# Patient Record
Sex: Female | Born: 1980 | Race: White | Hispanic: No | Marital: Married | Smoking: Former smoker
Health system: Southern US, Community
[De-identification: ages and names within clinical notes are randomized; demographics above are authoritative.]

## PROBLEM LIST (undated history)

## (undated) DIAGNOSIS — F419 Anxiety disorder, unspecified: Secondary | ICD-10-CM

## (undated) DIAGNOSIS — Z87442 Personal history of urinary calculi: Secondary | ICD-10-CM

## (undated) DIAGNOSIS — D649 Anemia, unspecified: Secondary | ICD-10-CM

## (undated) HISTORY — DX: Anemia, unspecified: D64.9

## (undated) HISTORY — PX: BREAST REDUCTION SURGERY: SHX8

## (undated) HISTORY — DX: Personal history of urinary calculi: Z87.442

## (undated) HISTORY — DX: Anxiety disorder, unspecified: F41.9

---

## 2007-01-08 ENCOUNTER — Ambulatory Visit: Payer: Self-pay | Admitting: Pediatrics

## 2008-02-24 ENCOUNTER — Ambulatory Visit: Payer: Self-pay | Admitting: Obstetrics and Gynecology

## 2008-09-26 ENCOUNTER — Inpatient Hospital Stay: Payer: Self-pay

## 2008-12-29 ENCOUNTER — Ambulatory Visit: Payer: Self-pay | Admitting: Family Medicine

## 2008-12-29 DIAGNOSIS — Z9189 Other specified personal risk factors, not elsewhere classified: Secondary | ICD-10-CM | POA: Insufficient documentation

## 2008-12-29 DIAGNOSIS — Z87442 Personal history of urinary calculi: Secondary | ICD-10-CM

## 2008-12-29 DIAGNOSIS — F411 Generalized anxiety disorder: Secondary | ICD-10-CM

## 2008-12-29 DIAGNOSIS — D649 Anemia, unspecified: Secondary | ICD-10-CM

## 2008-12-29 HISTORY — DX: Generalized anxiety disorder: F41.1

## 2008-12-30 ENCOUNTER — Encounter (INDEPENDENT_AMBULATORY_CARE_PROVIDER_SITE_OTHER): Payer: Self-pay | Admitting: *Deleted

## 2008-12-30 LAB — CONVERTED CEMR LAB
Basophils Absolute: 0 10*3/uL (ref 0.0–0.1)
CO2: 30 meq/L (ref 19–32)
Calcium: 9.6 mg/dL (ref 8.4–10.5)
Chloride: 104 meq/L (ref 96–112)
Creatinine, Ser: 0.9 mg/dL (ref 0.4–1.2)
Eosinophils Absolute: 0.1 10*3/uL (ref 0.0–0.7)
Glucose, Bld: 74 mg/dL (ref 70–99)
Hemoglobin: 14.2 g/dL (ref 12.0–15.0)
Lymphocytes Relative: 42.4 % (ref 12.0–46.0)
MCHC: 34.5 g/dL (ref 30.0–36.0)
MCV: 89.7 fL (ref 78.0–100.0)
Monocytes Absolute: 0.4 10*3/uL (ref 0.1–1.0)
Neutro Abs: 3.2 10*3/uL (ref 1.4–7.7)
Neutrophils Relative %: 50 % (ref 43.0–77.0)
RDW: 14.3 % (ref 11.5–14.6)

## 2009-05-20 ENCOUNTER — Ambulatory Visit: Payer: Self-pay | Admitting: Family Medicine

## 2009-05-20 LAB — CONVERTED CEMR LAB: Rapid Strep: POSITIVE

## 2009-05-26 ENCOUNTER — Telehealth: Payer: Self-pay | Admitting: Family Medicine

## 2009-06-08 IMAGING — US US RENAL KIDNEY
1 series · 17 of 20 positions shown · non-contrast
Comparison: none

REASON FOR EXAM: back pn microscopic hematuria  bilateral lower abd pn
COMMENTS:

[Series 1: us renal kidney · 17 of 20 slices shown]
[im 1/20]
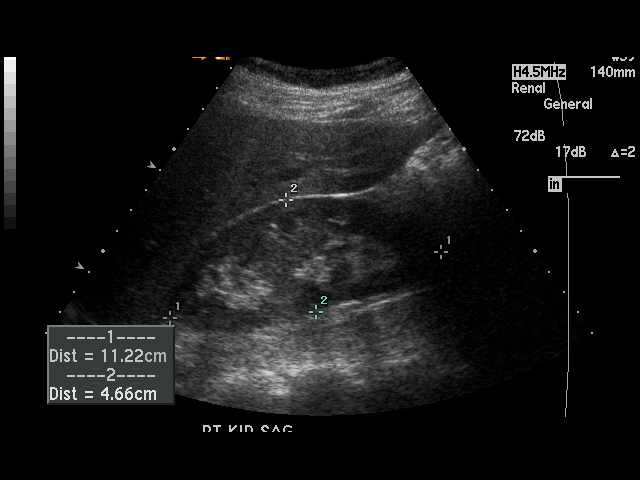
[im 2/20]
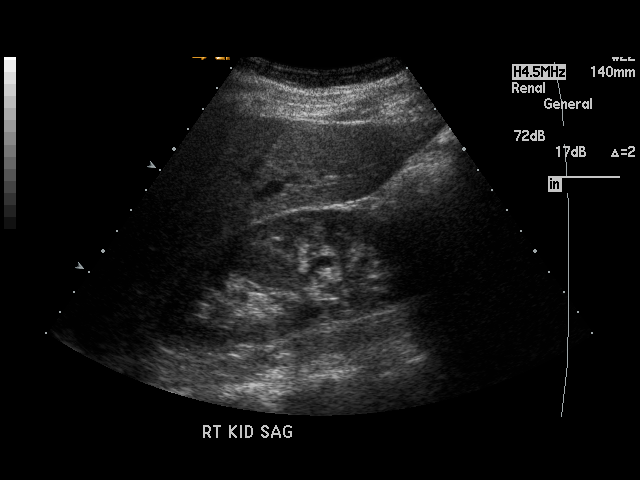
[im 3/20]
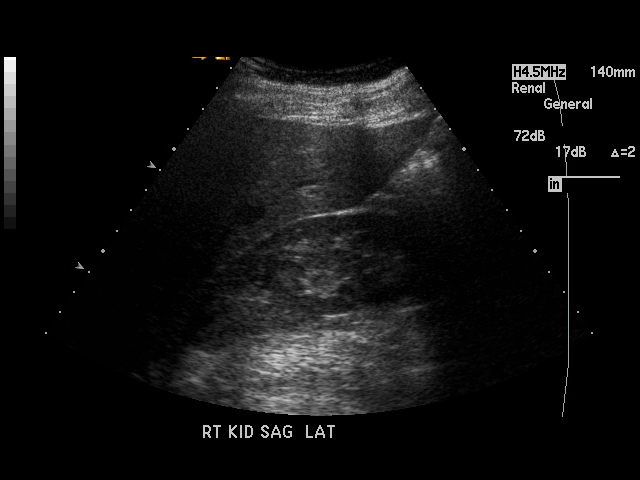
[im 5/20]
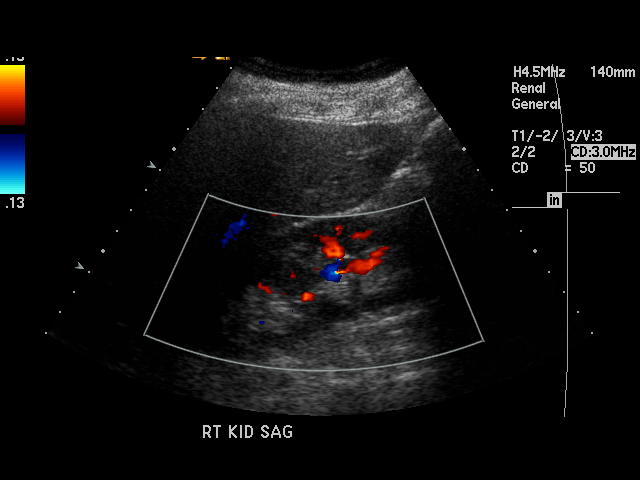
[im 6/20]
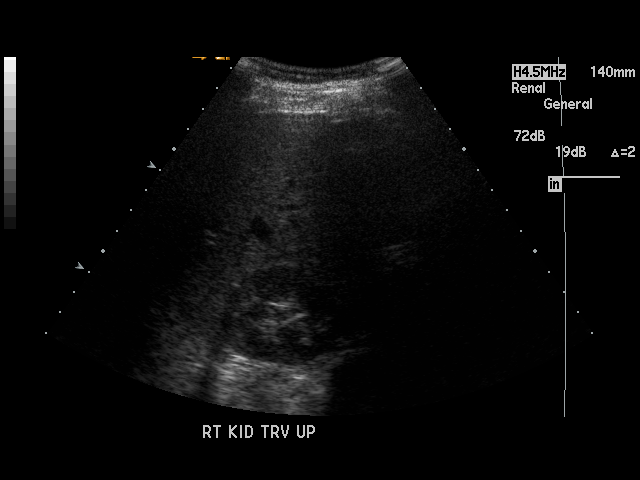
[im 7/20]
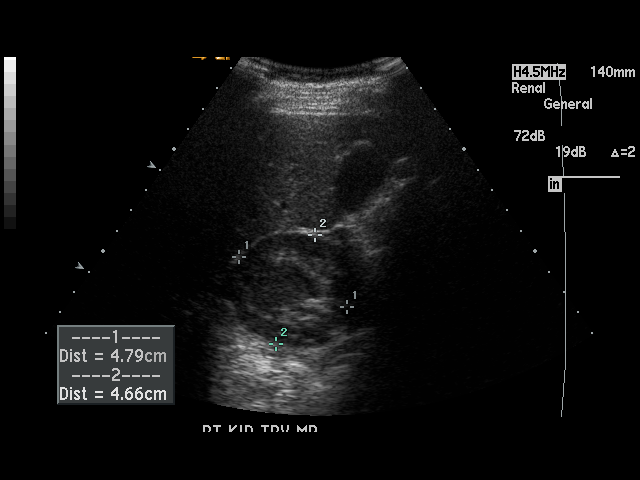
[im 8/20]
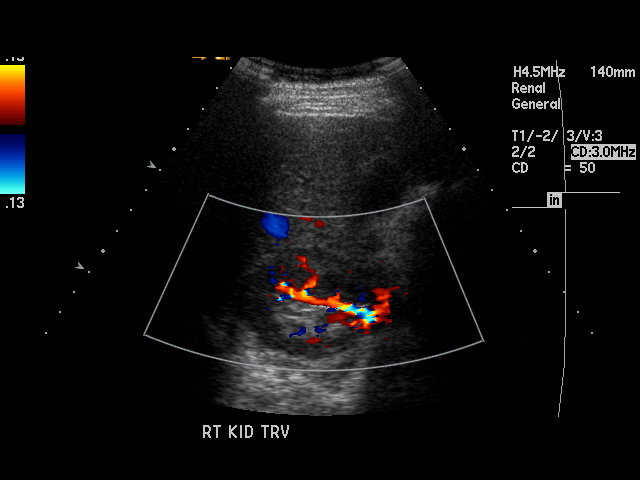
[im 9/20]
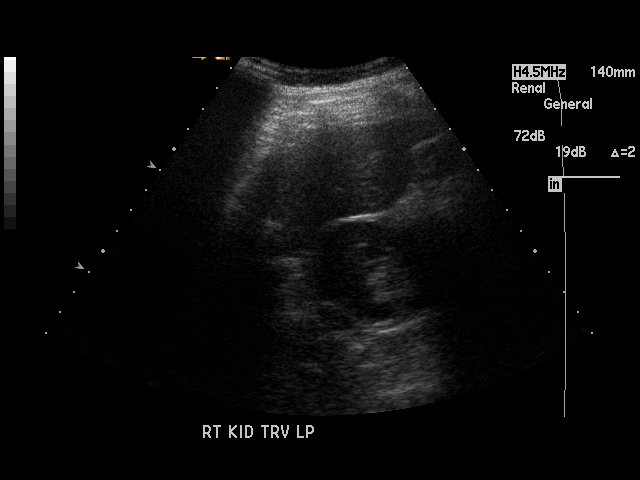
[im 11/20]
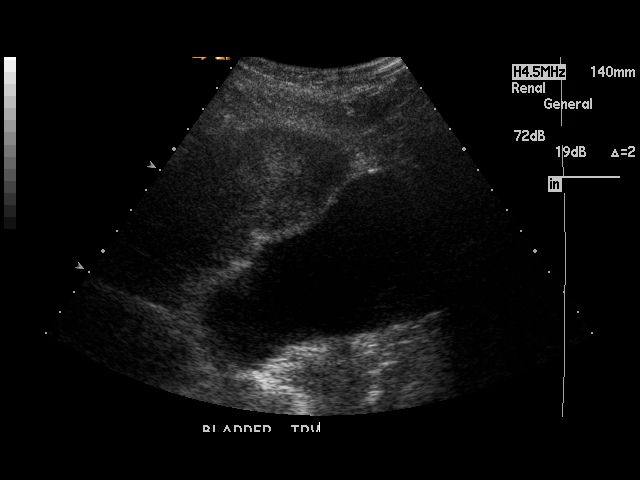
[im 12/20]
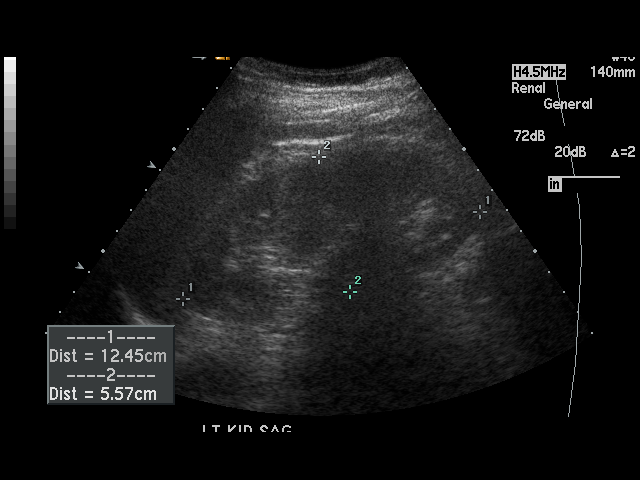
[im 13/20]
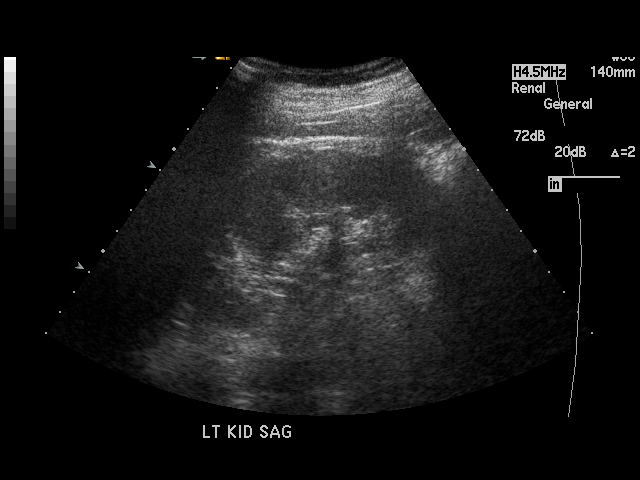
[im 14/20]
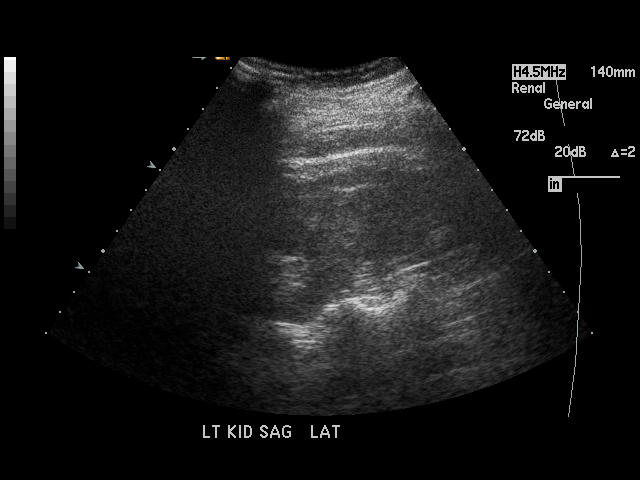
[im 15/20]
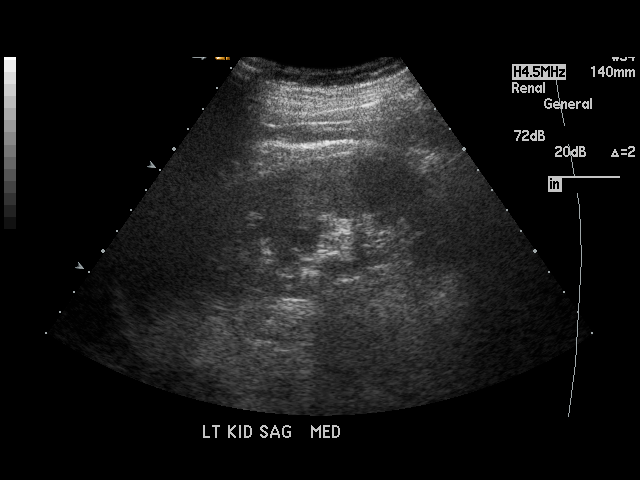
[im 16/20]
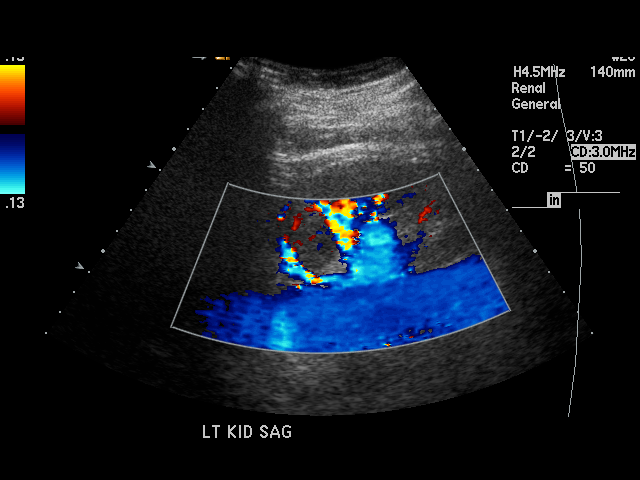
[im 18/20]
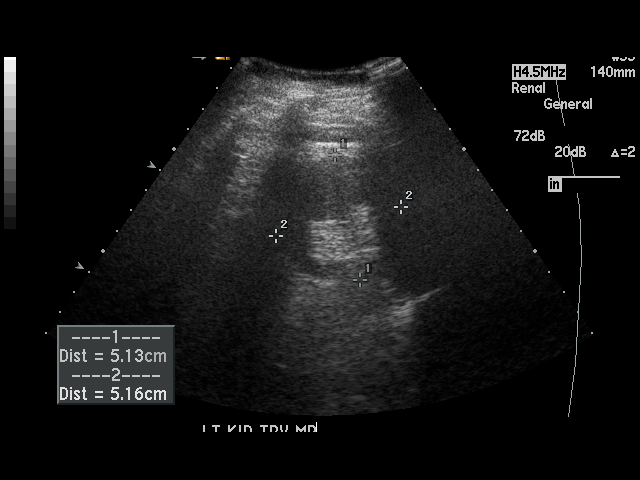
[im 19/20]
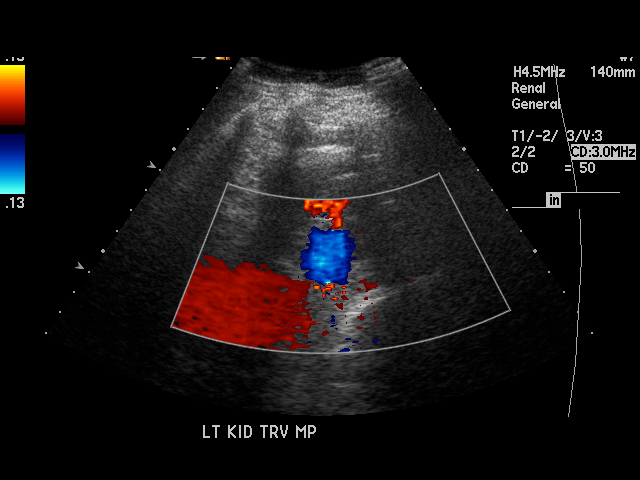
[im 20/20]
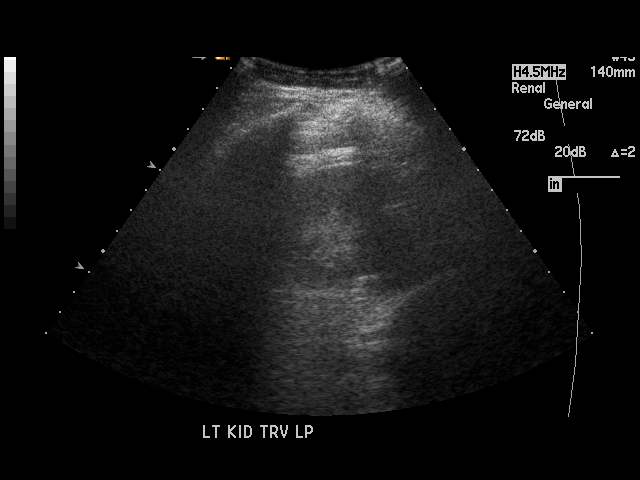

[17 of 20 positions shown; findings below may reference images not displayed]

PROCEDURE:     US  - US KIDNEY  - February 24, 2008  [DATE]

RESULT:     Sonographic evaluation of the kidneys demonstrates the RIGHT
kidney measures 11.2 x 4.6 x 4.8 cm while the LEFT kidney measures 12.5 x
5.6 x 5.1 cm. There is no obstruction. No definite mass or renal stone is
evident. The appearance is within normal limits.
IMPRESSION: Normal appearing renal sonogram.

## 2009-09-06 ENCOUNTER — Ambulatory Visit: Payer: Self-pay | Admitting: Family Medicine

## 2009-09-15 ENCOUNTER — Telehealth: Payer: Self-pay | Admitting: Family Medicine

## 2009-09-29 ENCOUNTER — Telehealth: Payer: Self-pay | Admitting: Family Medicine

## 2009-10-04 ENCOUNTER — Ambulatory Visit: Payer: Self-pay | Admitting: Family Medicine

## 2009-11-06 ENCOUNTER — Ambulatory Visit: Payer: Self-pay | Admitting: Family Medicine

## 2009-11-06 LAB — CONVERTED CEMR LAB: Rapid Strep: NEGATIVE

## 2009-12-21 ENCOUNTER — Ambulatory Visit: Payer: Self-pay | Admitting: Family Medicine

## 2009-12-21 DIAGNOSIS — R5381 Other malaise: Secondary | ICD-10-CM

## 2009-12-21 DIAGNOSIS — R5383 Other fatigue: Secondary | ICD-10-CM

## 2009-12-22 ENCOUNTER — Telehealth: Payer: Self-pay | Admitting: Family Medicine

## 2009-12-22 LAB — CONVERTED CEMR LAB
ALT: 21 units/L (ref 0–35)
BUN: 13 mg/dL (ref 6–23)
Basophils Absolute: 0 10*3/uL (ref 0.0–0.1)
Bilirubin, Direct: 0.1 mg/dL (ref 0.0–0.3)
Chloride: 103 meq/L (ref 96–112)
Creatinine, Ser: 0.6 mg/dL (ref 0.4–1.2)
Eosinophils Absolute: 0.1 10*3/uL (ref 0.0–0.7)
Eosinophils Relative: 1.2 % (ref 0.0–5.0)
Ferritin: 20.6 ng/mL (ref 10.0–291.0)
Free T4: 0.84 ng/dL (ref 0.60–1.60)
Glucose, Bld: 81 mg/dL (ref 70–99)
HCT: 40.4 % (ref 36.0–46.0)
Lymphs Abs: 2.5 10*3/uL (ref 0.7–4.0)
MCHC: 34.4 g/dL (ref 30.0–36.0)
MCV: 93.3 fL (ref 78.0–100.0)
Monocytes Absolute: 0.4 10*3/uL (ref 0.1–1.0)
Neutrophils Relative %: 52.6 % (ref 43.0–77.0)
Platelets: 294 10*3/uL (ref 150.0–400.0)
Potassium: 4.1 meq/L (ref 3.5–5.1)
RDW: 14.4 % (ref 11.5–14.6)
T3, Free: 2.6 pg/mL (ref 2.3–4.2)
TSH: 1.06 microintl units/mL (ref 0.35–5.50)
Total Bilirubin: 0.8 mg/dL (ref 0.3–1.2)
WBC: 6.3 10*3/uL (ref 4.5–10.5)

## 2009-12-28 ENCOUNTER — Encounter: Payer: Self-pay | Admitting: Family Medicine

## 2010-01-03 ENCOUNTER — Telehealth: Payer: Self-pay | Admitting: Family Medicine

## 2010-01-05 ENCOUNTER — Telehealth: Payer: Self-pay | Admitting: Family Medicine

## 2010-02-01 ENCOUNTER — Telehealth: Payer: Self-pay | Admitting: Family Medicine

## 2010-02-15 ENCOUNTER — Encounter: Payer: Self-pay | Admitting: Family Medicine

## 2010-02-24 ENCOUNTER — Ambulatory Visit: Payer: Self-pay | Admitting: Family Medicine

## 2010-03-08 ENCOUNTER — Telehealth: Payer: Self-pay | Admitting: Family Medicine

## 2010-04-04 ENCOUNTER — Ambulatory Visit: Payer: Self-pay | Admitting: Family Medicine

## 2010-04-04 DIAGNOSIS — F325 Major depressive disorder, single episode, in full remission: Secondary | ICD-10-CM | POA: Insufficient documentation

## 2010-04-04 HISTORY — DX: Major depressive disorder, single episode, in full remission: F32.5

## 2010-06-16 ENCOUNTER — Ambulatory Visit
Admission: RE | Admit: 2010-06-16 | Discharge: 2010-06-16 | Payer: Self-pay | Source: Home / Self Care | Attending: Family Medicine | Admitting: Family Medicine

## 2010-06-16 DIAGNOSIS — G44209 Tension-type headache, unspecified, not intractable: Secondary | ICD-10-CM | POA: Insufficient documentation

## 2010-06-28 NOTE — Progress Notes (Signed)
Summary: wants to change from paxil  Phone Note Call from Patient Call back at Home Phone [phone]   Caller: Patient Call For: Hannah Beat MD Summary of Call: Pt states she increased her paxil a couple of weeks ago and is concerned about some of the side effects- such as weight gain, and the difficulty coming off of it.  She is asking if she can change to something different.  [facility] [place].  Please let pt know.           [name] CMA  January 03, 2010 4:13 PM   Follow-up for Phone Call        she can make this choice -- keep in mind that last I checked, she was doing better on this medication.   stop her paxil 12.5 mg so she is only on 25 mg, then take 12.5 mg tabs for a week, then d/c completely.  After this taper, start Effexor XR 75 mg, 1 by mouth daily, then f/u with me 3 weeks later. #30, 1 refill  Follow-up by: Hannah Beat MD,  January 03, 2010 7:38 PM  Additional Follow-up for Phone Call Additional follow up Details #1::        patient advised and rx called in.Consuello Masse CMA   Additional Follow-up by: Benny Lennert CMA Duncan Dull),  January 04, 2010 8:18 AM    New/Updated Medications: EFFEXOR XR 75 MG XR24H-CAP (VENLAFAXINE HCL) take one tablet daily Prescriptions: EFFEXOR XR 75 MG XR24H-CAP (VENLAFAXINE HCL) take one tablet daily  #30 x 1   Entered by:   Benny Lennert CMA (AAMA)   Authorized by:   Hannah Beat MD   Signed by:   Benny Lennert CMA (AAMA) on 01/04/2010   Method used:   Electronically to        [facility]       [address]       [place], [place]  [address]       Ph: [phone]       Fax: [phone]   RxID:   [id number]

## 2010-06-28 NOTE — Progress Notes (Signed)
Summary: regarding paxil  Phone Note Call from Patient Call back at Home Phone [phone]   Caller: Patient Summary of Call: Pt is asking if she is to take both paxil 25 and 12.5 mg's daily.   Initial call taken by: [name] CMA,  December 22, 2009 4:06 PM  Follow-up for Phone Call        yes --- 25 + 12.5 for a total of 37.5 mg a day Follow-up by: Hannah Beat MD,  December 22, 2009 4:12 PM  Additional Follow-up for Phone Call Additional follow up Details #1::        Patient advised.Consuello Masse CMA   Additional Follow-up by: Benny Lennert CMA Duncan Dull),  December 22, 2009 4:15 PM

## 2010-06-28 NOTE — Progress Notes (Signed)
Summary: regarding klonopin  Phone Note Call from Patient Call back at Home Phone [phone]   Caller: Patient Call For: Hannah Beat MD Summary of Call: Pt was seen on 4/11 and given klonopin for anxiety.  She was told to call back if she got to the point that she felt like she has to take this every day.  She does, and  has been taking it everyday.  She is not sure this is helping.  She wants something less addictive that she can take every day.  [facility] [place].  Her sister takes lexapro and pt is asking if that would help her. Initial call taken by: [name] CMA,  September 15, 2009 12:52 PM  Follow-up for Phone Call        Lexapro is a good idea -- it is expensive and insurance will not pay for it unless you try one of the generics first. Generic Celexa is almost the same as that. We should try that. I want her to f/u with me in 4 weeks. Follow-up by: Hannah Beat MD,  September 15, 2009 1:04 PM  Additional Follow-up for Phone Call Additional follow up Details #1::        rx sent to pharmacy and patient advised.Consuello Masse CMA  Additional Follow-up by: Benny Lennert CMA Duncan Dull),  September 15, 2009 2:06 PM    New/Updated Medications: CITALOPRAM HYDROBROMIDE 20 MG TABS (CITALOPRAM HYDROBROMIDE) take one tablet by mouth daily Prescriptions: CITALOPRAM HYDROBROMIDE 20 MG TABS (CITALOPRAM HYDROBROMIDE) take one tablet by mouth daily  #30 x 3   Entered by:   Benny Lennert CMA (AAMA)   Authorized by:   Hannah Beat MD   Signed by:   Benny Lennert CMA (AAMA) on 09/15/2009   Method used:   Electronically to        [facility]       [address]       [place], [place]  [address]       Ph: [phone]       Fax: [phone]   RxID:   [id number] CITALOPRAM HYDROBROMIDE 20 MG TABS (CITALOPRAM HYDROBROMIDE) take one tablet by mouth daily  #30 x 3   Entered and Authorized by:   Hannah Beat MD  Signed by:   Hannah Beat MD on 09/15/2009   Method used:   Telephoned to ...         RxID:   [id number]

## 2010-06-28 NOTE — Assessment & Plan Note (Signed)
Summary: FOLLOW-UP/JRR   Vital Signs:  Patient profile:   30 year old female Height:      67 inches Weight:      146.0 pounds BMI:     22.95 Temp:     98.7 degrees F oral Pulse rate:   76 / minute Pulse rhythm:   regular BP sitting:   100 / 70  (left arm) Cuff size:   regular  Vitals Entered By: Benny Lennert CMA Duncan Dull) (February 24, 2010 3:10 PM)  History of Present Illness: Chief complaint follow up  PAXIL, CELEXA, EFFEXOR UNABLE TO TOL  prozac?  was tense and irritable when on it.  felt really bad after taking effexor.     Allergies: 1)  ! Amoxicillin  Past History:  Past medical, surgical, family and social histories (including risk factors) reviewed, and no changes noted (except as noted below).  Past Medical History: Reviewed history from 12/21/2009 and no changes required. ANXIETY  NEPHROLITHIASIS, HX OF ANEMIA (h/o)  Past Surgical History: Reviewed history from 12/29/2008 and no changes required. none  Family History: Reviewed history from 12/29/2008 and no changes required. Family History of Alcoholism/Addiction Family History of Arthritis Family History Depression Family History High cholesterol Family History of Stroke F 1st degree relative <60 Family History of Stroke M 1st degree relative <50  Social History: Reviewed history from 11/06/2009 and no changes required. Occupation: Clinical biochemist Married Alcohol use-yes Drug use-no Regular exercise-yes   Impression & Recommendations:  Problem # 1:  ANXIETY (ICD-300.00) >15 minutes spent in face to face time with patient, >50% spent in counselling or coordination of care: multiple meds as described above, unable to tol all above with various SE. Now, off meds more stable with anxiety attacks a couple of times a week. Had some difficulty with weight gain - alarmed by rate. Overall, feels more stable, less school stress. We discussed risks and benefits, diminishing klonopin return on as  needed, change to ativan as needed anxiety attacks.   The following medications were removed from the medication list:    Clonazepam 1 Mg Tabs (Clonazepam) .[facility]... 1 by mouth two times a day as needed anxiety    Fluoxetine Hcl 10 Mg Caps (Fluoxetine hcl) .[facility]... 1 by mouth daily Her updated medication list for this problem includes:    Lorazepam 1 Mg Tabs (Lorazepam) .[facility]... 1 by mouth two times a day as needed  Complete Medication List: 1)  Lorazepam 1 Mg Tabs (Lorazepam) .[facility].. 1 by mouth two times a day as needed Prescriptions: LORAZEPAM 1 MG TABS (LORAZEPAM) 1 by mouth two times a day as needed  #30 x 1   Entered and Authorized by:   Hannah Beat MD   Signed by:   Hannah Beat MD on 02/24/2010   Method used:   Print then Give to Patient   RxID:   [id number]   Current Allergies (reviewed today): ! AMOXICILLIN

## 2010-06-28 NOTE — Assessment & Plan Note (Signed)
Summary: ANXIETY/CLE   Vital Signs:  Patient profile:   30 year old female Height:      72 inches Weight:      130.2 pounds BMI:     17.72 Temp:     98.3 degrees F oral Pulse rate:   72 / minute Pulse rhythm:   regular BP sitting:   90 / 60  (left arm) Cuff size:   regular  Vitals Entered By: Benny Lennert CMA Duncan Dull) (September 06, 2009 3:38 PM)  History of Present Illness: Chief complaint anxiety  30 year old:  Has had some issues with anxiety in the past. For about ten years. Would often worry.  Has now had two children.   Fear about not having a job next year.  More anxiety attack, not eneralized.  Constant and sometimes will not go away.   Started about last week or so bad. Had a big testing meeting last Monday.  Sunday was really bad maybe could not have had to go back into work.   Klonopin 1 mg.   Next week - IUD. [facility], [place].      Allergies (verified): No Known Drug Allergies  Past History:  Past medical, surgical, family and social histories (including risk factors) reviewed, and no changes noted (except as noted below).  Past Medical History: Reviewed history from 12/29/2008 and no changes required. ANXIETY  NEPHROLITHIASIS, HX OF ANEMIA (h/o)    Past Surgical History: Reviewed history from 12/29/2008 and no changes required. none  Family History: Reviewed history from 12/29/2008 and no changes required. Family History of Alcoholism/Addiction Family History of Arthritis Family History Depression Family History High cholesterol Family History of Stroke F 1st degree relative <60 Family History of Stroke M 1st degree relative <50  Social History: Reviewed history from 12/29/2008 and no changes required. Occupation: Secretary/administrator Married Alcohol use-yes Drug use-no Regular exercise-yes  Review of Systems General:  Denies chills and fever. Psych:  Complains of anxiety and panic attacks; denies alternate hallucination (  auditory/visual), depression, thoughts of violence, and unusual visions or sounds.  Physical Exam  Additional Exam:  GEN: WDWN, NAD, Non-toxic, A & O x 3 HEENT: Atraumatic, Normocephalic. Neck supple. No masses, No LAD. Ears and Nose: No external deformity. CV: RRR, No M/G/R. No JVD. No thrill. No extra heart sounds. PULM: CTA B, no wheezes, crackles, rhonchi. No retractions. No resp. distress. No accessory muscle use. EXTR: No c/c/e NEURO: Normal gait.  PSYCH: Normally interactive. Conversant. Not depressed or anxious appearing.  Calm demeanor.     Impression & Recommendations:  Problem # 1:  ANXIETY (ICD-300.00) Assessment Deteriorated >25 minutes spent in face to face time with patient, >50% spent in counselling or coordination of care: deteriorated, discussed situation at home and work, stress at school. Question about job future.   Her updated medication list for this problem includes:    Clonazepam 1 Mg Tabs (Clonazepam) .[facility]... 1 by mouth two times a day as needed anxiety  Complete Medication List: 1)  Pre-natal Tabs (Prenatal multivit-min-fe-fa) .... One a day 2)  Clonazepam 1 Mg Tabs (Clonazepam) .[facility].. 1 by mouth two times a day as needed anxiety  Patient Instructions: 1)  f/u 1 month Prescriptions: CLONAZEPAM 1 MG TABS (CLONAZEPAM) 1 by mouth two times a day as needed anxiety  #30 x 2   Entered and Authorized by:   Hannah Beat MD   Signed by:   Hannah Beat MD on 09/06/2009   Method used:   Print then  Give to Patient   RxID:   [id number]   Current Allergies (reviewed today): No known allergies

## 2010-06-28 NOTE — Assessment & Plan Note (Signed)
Summary: SORE THROAT-EARACHE/JBB   Vital Signs:  Patient Profile:   30 Years Old Female CC:      sore throat;ear aches body aches Height:     72 inches Weight:      129 pounds Temp:     98.3 degrees F oral Pulse rate:   76 / minute Pulse rhythm:   regular Resp:     18 per minute BP sitting:   90 / 60  (left arm) Cuff size:   regular  Vitals Entered By: Providence Crosby LPN (November 06, 2009 3:04 PM)                  Current Allergies: ! AMOXICILLINHistory of Present Illness History from: patient Reason for visit: sore throat x 3 days Chief Complaint: sore throat;ear aches body aches History of Present Illness: sore throat x 3 days ;history of strep 2 nights ago had pressure type headache/ complains of weak feeling  A few months ago she had siimilar illness and waited 2 weeks before seeing doctor, and was strep and was told to not wait as long.  She ended up with bad reactiion from Amoxicillin then.  REVIEW OF SYSTEMS Constitutional Symptoms       Complains of fever and chills.     Denies night sweats, weight loss, weight gain, and fatigue.  Eyes       Denies change in vision, eye pain, eye discharge, glasses, contact lenses, and eye surgery. Ear/Nose/Throat/Mouth       Complains of ear pain and sore throat.      Denies hearing loss/aids, change in hearing, ear discharge, dizziness, frequent runny nose, frequent nose bleeds, sinus problems, hoarseness, and tooth pain or bleeding.  Respiratory       Denies dry cough, productive cough, wheezing, shortness of breath, asthma, bronchitis, and emphysema/COPD.  Cardiovascular       Denies murmurs, chest pain, and tires easily with exhertion.    Gastrointestinal       Denies stomach pain, nausea/vomiting, diarrhea, constipation, blood in bowel movements, and indigestion. Genitourniary       Denies painful urination, kidney stones, and loss of urinary control. Neurological       Complains of headaches.      Denies paralysis,  seizures, and fainting/blackouts.      Comments: pressure type headache Musculoskeletal       Denies muscle pain, joint pain, joint stiffness, decreased range of motion, redness, swelling, muscle weakness, and gout.  Skin       Denies bruising, unusual mles/lumps or sores, and hair/skin or nail changes.  Psych       Denies mood changes, temper/anger issues, anxiety/stress, speech problems, depression, and sleep problems.  Social History: Occupation: Clinical biochemist Married Alcohol use-yes Drug use-no Regular exercise-yes Physical Exam General appearance: well developed, well nourished, no acute distress Head: normocephalic, atraumatic Eyes: conjunctivae and lids normal Pupils: equal, round, reactive to light Ears: normal, no lesions or deformities Nasal: mucosa pink, nonedematous, no septal deviation, turbinates normal Oral/Pharynx: pharyngeal erythema without exudate, uvula midline without deviation Neck: supple,anterior lymphadenopathy trace Thyroid: no nodules, masses, tenderness, or enlargement Chest/Lungs: no rales, wheezes, or rhonchi bilateral, breath sounds equal without effort Heart: regular rate and  rhythm, no murmur GU: deferred Extremities: normal extremities Neurological: grossly intact and non-focal Skin: no obvious rashes or lesions MSE: oriented to time, place, and person Assessment New Problems: PHARYNGITIS, ACUTE (ICD-462) SORE THROAT (ICD-462) SORE THROAT (ICD-462)   Plan New Medications/Changes: DOXYCYCLINE HYCLATE 100  MG CAPS (DOXYCYCLINE HYCLATE) 1 capsule two times a day with food x 10 days  #20 x 0, 11/06/2009, Providence Crosby LPN  New Orders: Rapid Strep-[facility] [16109] New Patient Level II [60454]  The patient and/or caregiver has been counseled thoroughly with regard to medications prescribed including dosage, schedule, interactions, rationale for use, and possible side effects and they verbalize understanding.  Diagnoses and expected course of  recovery discussed and will return if not improved as expected or if the condition worsens. Patient and/or caregiver verbalized understanding.  Prescriptions: DOXYCYCLINE HYCLATE 100 MG CAPS (DOXYCYCLINE HYCLATE) 1 capsule two times a day with food x 10 days  #20 x 0   Entered by:   Providence Crosby LPN   Authorized by:   Kathrynn Running MD   Signed by:   Providence Crosby LPN on 09/81/1914   Method used:   Electronically to        [facility] (retail)       [address]       [place], [place]  [address]       Ph: [phone]       Fax: [phone]   RxID:   [id number]   Orders Added: 1)  Rapid Strep-[facility] [87430] 2)  New Patient Level II [99202]  Laboratory Results  Date/Time Received: November 06, 2009 3:08 PM Date/Time Reported: November 06, 2009 3:08 PM  Other Tests  Rapid Strep: negative  The patient was informed that there is no on-call provider or services available at this clinic during off-hours (when the clinic is closed).  If the patient developed a problem or concern that required immediate attention, the patient was advised to go the the nearest available urgent care or emergency department for medical care.  The patient verbalized understanding.     It was clearly explained to the patient that this [facility] is not intended to be a primary care clinic.  The patient is always better served by the continuity of care and the provider/patient relationships developed with their dedicated primary care provider.  The patient was told to be sure to follow up as soon as possible with their primary care provider to discuss treatments received and to receive further examination and testing.  The patient verbalized understanding. The will f/u with PCP ASAP.   The risks, benefits and possible side effects were clearly explained and discussed with the patient.  The patient verbalized clear understanding.  The patient was given instructions to return if  symptoms don't improve, worsen or new changes develop.  If it is not during clinic hours and the patient cannot get back to this clinic then the patient was told to seek medical care at an available urgent care or emergency department.  The patient verbalized understanding.    I have reviewed the above medical office visit documention, including diagnoses, history, medications, clinical lists, orders and plan of care.   Rodney Langton, MD, FAAFP  November 22, 2009 Added new allergy or adverse reaction of AMOXICILLIN - Signed

## 2010-06-28 NOTE — Progress Notes (Signed)
Summary: refill request for clonazepam  Phone Note Refill Request Message from:  Fax from Pharmacy  Refills Requested: Medication #1:  CLONAZEPAM 1 MG TABS 1 by mouth two times a day as needed anxiety   Last Refilled: 12/06/2009 [facility]  Initial call taken by: [name] CMA,  January 05, 2010 4:38 PM    Prescriptions: CLONAZEPAM 1 MG TABS (CLONAZEPAM) 1 by mouth two times a day as needed anxiety  #30 x 0   Entered and Authorized by:   Hannah Beat MD   Signed by:   Hannah Beat MD on 01/05/2010   Method used:   Telephoned to ...       [facility] ([facility]ail)       [address]       [place], [place]  [address]       Ph: [phone]       Fax: [phone]   RxID:   [id number]   Appended Document: refill request for clonazepam rx called to pharmacy.Consuello Masse CMA

## 2010-06-28 NOTE — Assessment & Plan Note (Signed)
Summary: F/U/CLE   Vital Signs:  Patient profile:   30 year old female Height:      67 inches Weight:      142.0 pounds BMI:     22.32 Temp:     98.0 degrees F oral Pulse rate:   76 / minute Pulse rhythm:   regular BP sitting:   100 / 70  (left arm) Cuff size:   regular  Vitals Entered By: Benny Lennert CMA Duncan Dull) (April 04, 2010 3:47 PM)  History of Present Illness: Chief complaint follow up mood  Allergies: 1)  ! Amoxicillin   Impression & Recommendations:  Problem # 1:  ANXIETY (ICD-300.00) >15 minutes spent in face to face time with patient, >50% spent in counselling or coordination of care: doing quite a bit better. some continued anxiety but better. anhedonia symptoms have improved. decreased irritability. tolerated wellbutrin without problems.  Her updated medication list for this problem includes:    Lorazepam 1 Mg Tabs (Lorazepam) .[facility]... 1 by mouth two times a day as needed    Bupropion Hcl 300 Mg Xr24h-tab (Bupropion hcl) .[facility]... 1 by mouth daily  Problem # 2:  DEPRESSIVE DISORDER (ICD-311)  Her updated medication list for this problem includes:    Lorazepam 1 Mg Tabs (Lorazepam) .[facility]... 1 by mouth two times a day as needed    Bupropion Hcl 300 Mg Xr24h-tab (Bupropion hcl) .[facility]... 1 by mouth daily  Complete Medication List: 1)  Lorazepam 1 Mg Tabs (Lorazepam) .[facility].. 1 by mouth two times a day as needed 2)  Bupropion Hcl 300 Mg Xr24h-tab (Bupropion hcl) .[facility].. 1 by mouth daily  Patient Instructions: 1)  fu 3-4 months Prescriptions: BUPROPION HCL 300 MG XR24H-TAB (BUPROPION HCL) 1 by mouth daily  #30 x 11   Entered and Authorized by:   Hannah Beat MD   Signed by:   Hannah Beat MD on 04/04/2010   Method used:   Electronically to        [facility]       [address]       [place], [place]  [address]       Ph: [phone]       Fax: [phone]   RxID:   [id number]    Orders Added: 1)   Est. Patient Level III [09323]    Current Allergies (reviewed today): ! AMOXICILLIN

## 2010-06-28 NOTE — Progress Notes (Signed)
Summary: celexa is not helping  Phone Note Call from Patient Call back at Home Phone [phone]   Caller: Patient Call For: Beth Long Summary of Call: Pt has been taking celexa for about 3 weeks.  She says this is not helping with her anxiety and is causing extreme fatigue, she is taking this at night.  The fatigue is getting worse and she says she feels worse than she did before.  Please advise on what she shoud do.  [facility] [place].  She has tried zoloft and buspar in the past but neither of these worked. Initial call taken by: [name] CMA,  Sep 29, 2009 3:32 PM  Follow-up for Phone Call        d/c celexa  Will start a slightly different class of med for anxiety, and she should f/u with me in 2 weeks Follow-up by: Beth Long,  Sep 29, 2009 5:36 PM  Additional Follow-up for Phone Call Additional follow up Details #1::        Patient said that everyone she has talked to have had problems coming off of the medication.    Additional Follow-up for Phone Call Additional follow up Details #2::    Lori Liew wanted a fellow up next week.Consuello Masse CMA  Follow-up by: Benny Lennert CMA Duncan Dull),  Sep 30, 2009 8:13 AM  New/Updated Medications: VENLAFAXINE HCL 75 MG XR24H-CAP (VENLAFAXINE HCL) 1 by mouth daily (failure, zoloft, celexa, buspar, klonopin) Prescriptions: VENLAFAXINE HCL 75 MG XR24H-CAP (VENLAFAXINE HCL) 1 by mouth daily (failure, zoloft, celexa, buspar, klonopin)  #30 x 3   Entered by:   Benny Lennert CMA (AAMA)   Authorized by:   Beth Long   Signed by:   Benny Lennert CMA (AAMA) on 09/30/2009   Method used:   Electronically to        [facility]       [address]       [place], [place]  [address]       Ph: [phone]       Fax: [phone]   RxID:   [id number] VENLAFAXINE HCL 75 MG XR24H-CAP (VENLAFAXINE HCL) 1 by mouth daily (failure, zoloft, celexa,  buspar, klonopin)  #30 x 3   Entered and Authorized by:   Beth Long   Signed by:   Beth Long on 09/29/2009   Method used:   Telephoned to ...         RxID:   [id number]

## 2010-06-28 NOTE — Progress Notes (Signed)
Summary: wants to try anti depressant  Phone Note Call from Patient Call back at Home Phone [phone]   Caller: Patient Call For: Hannah Beat MD Summary of Call: Pt has been seeing a counselor for her anxiety and they have brought up that maybe pt should try an antidepressant.  She has had celexa, prozac, paxil, effexor.  [facility] [place].  Father takes wellbutrin, which helps him.   Would you want to see her first before prescribing? Initial call taken by: [name] CMA,  March 08, 2010 4:26 PM  Follow-up for Phone Call        i know this patient well and she has been unable to tolerate all these antidepressants in past.   I think it is reasonable to go ahead and start wellbutrin, then follow-up with me in 1 month to recheck. Hannah Beat MD  March 09, 2010 9:20 AM   Additional Follow-up for Phone Call Additional follow up Details #1::        Patient advised.Consuello Masse CMA   Additional Follow-up by: Benny Lennert CMA Duncan Dull),  March 09, 2010 9:25 AM    New/Updated Medications: BUPROPION HCL 150 MG XR24H-TAB (BUPROPION HCL) 1 by mouth daily (failure paxil, prozac, celexa, effexor) Prescriptions: BUPROPION HCL 150 MG XR24H-TAB (BUPROPION HCL) 1 by mouth daily (failure paxil, prozac, celexa, effexor)  #30 x 2   Entered and Authorized by:   Hannah Beat MD   Signed by:   Hannah Beat MD on 03/09/2010   Method used:   Electronically to        [facility]       [address]       [place], [place]  [address]       Ph: [phone]       Fax: [phone]   RxID:   [id number]

## 2010-06-28 NOTE — Assessment & Plan Note (Signed)
Summary: F/U/CLE   Vital Signs:  Patient profile:   30 year old female Height:      72 inches Weight:      135.4 pounds BMI:     18.43 Temp:     98.5 degrees F oral Pulse rate:   76 / minute Pulse rhythm:   regular BP sitting:   110 / 72  (left arm) Cuff size:   large  Vitals Entered By: Benny Lennert CMA Duncan Dull) (December 21, 2009 11:52 AM)  History of Present Illness: Chief complaint follow up  29 f/u anxiety:doing better, but still not without  symptoms. She is doing much better on Paxil compared to other medication she has tried the past  still having some faigue  saw an RD she has gained back about 5 pounds  Bruis  ing: no known causes,  she hasn't had any trauma she can recall  hot flashes and insomnia, however upon further questioning, she does state she is sleeping 9 hours a night. Just feel tired much of the day.  sleeping 9 hours of sleep a night   no CP and shortnes of breath  ros above. No fever, chills. Fatigue noted.  Allergies: 1)  ! Amoxicillin  Past History:  Past medical, surgical, family and social histories (including risk factors) reviewed, and no changes noted (except as noted below).  Past Medical History: ANXIETY  NEPHROLITHIASIS, HX OF ANEMIA (h/o)  Past Surgical History: Reviewed history from 12/29/2008 and no changes required. none  Family History: Reviewed history from 12/29/2008 and no changes required. Family History of Alcoholism/Addiction Family History of Arthritis Family History Depression Family History High cholesterol Family History of Stroke F 1st degree relative <60 Family History of Stroke M 1st degree relative <50  Social History: Reviewed history from 11/06/2009 and no changes required. Occupation: Clinical biochemist Married Alcohol use-yes Drug use-no Regular exercise-yes  Physical Exam  Additional Exam:  GEN: WDWN, NAD, Non-toxic, A & O x 3 HEENT: Atraumatic, Normocephalic. Neck supple. No masses, No  LAD. Ears and Nose: No external deformity. CV: RRR, No M/G/R. No JVD. No thrill. No extra heart sounds. PULM: CTA B, no wheezes, crackles, rhonchi. No retractions. No resp. distress. No accessory muscle use. EXTR: No c/c/e NEURO: Normal gait.  PSYCH: Normally interactive. Conversant. Not depressed or anxious appearing.  Calm demeanor.     Impression & Recommendations:  Problem # 1:  FATIGUE (ICD-780.79) reasonable to reassess organic causes of the patient's fatigue and symptoms. This could all be coming from some underlying depression. She doesn't have signs or symptoms of sleep apnea. I think this is all reasonable to undergo,, however if all this comes back negative, I would not pursue anything further and tried to reassure the patient.  Orders: Venipuncture ([address]) TLB-BMP (Basic Metabolic Panel-BMET) (80048-METABOL) TLB-CBC Platelet - w/Differential (85025-CBCD) TLB-Hepatic/Liver Function Pnl (80076-HEPATIC) TLB-TSH (Thyroid Stimulating Hormone) (84443-TSH) TLB-Ferritin (82728-FER) TLB-T3, Free (Triiodothyronine) (84481-T3FREE) TLB-T4 (Thyrox), Free [phone])  Problem # 2:  ANXIETY (ICD-300.00) Assessment: Improved  Her updated medication list for this problem includes:    Clonazepam 1 Mg Tabs (Clonazepam) [facility]... 1 by mouth two times a day as needed anxiety    Paroxetine Hcl 12.5 Mg Xr24h-tab (Paroxetine hcl) .[facility]... 1 by mouth daily    Paxil Cr 25 Mg Xr24h-tab (Paroxetine hcl) .[facility]... 1 by mouth daily  Complete Medication List: 1)  Pre-natal Tabs (Prenatal multivit-min-fe-fa) .... One a day 2)  Clonazepam 1 Mg Tabs (Clonazepam) .[facility].. 1 by mouth two times a day  as needed anxiety 3)  Paroxetine Hcl 12.5 Mg Xr24h-tab (Paroxetine hcl) .[facility].. 1 by mouth daily 4)  Paxil Cr 25 Mg Xr24h-tab (Paroxetine hcl) .[facility].. 1 by mouth daily  Patient Instructions: 1)  f/u 6 months Prescriptions: PAROXETINE HCL 12.5 MG XR24H-TAB (PAROXETINE HCL) 1 by mouth daily  #30 x 11   Entered and Authorized  by:   Hannah Beat MD   Signed by:   Hannah Beat MD on 12/21/2009   Method used:   Electronically to        [facility]       [address]       [place], [place]  [address]       Ph: [phone]       Fax: [phone]   RxID:   [id number]   Current Allergies (reviewed today): ! AMOXICILLIN

## 2010-06-28 NOTE — Assessment & Plan Note (Signed)
Summary: anxiety/hmw   Vital Signs:  Patient profile:   30 year old female Height:      72 inches Weight:      130.2 pounds Pulse rate:   70 / minute Resp:     20 per minute  Vitals Entered By: Hannah Beat MD (Oct 04, 2009 6:30 PM)  Allergies: No Known Drug Allergies   Impression & Recommendations:  Problem # 1:  ANXIETY (ICD-300.00) >15 minutes spent in face to face time with patient, >50% spent in counselling or coordination of care: at this point, the patient is not improved.  She did take Celexa, ongoing since the time of her last visit. Or at least since our last telephone conversation. She is had no benefit in terms of her anxiety. She is still having anxiety attacks almost daily. She felt quite tired and had a difficult time functioning while on Celexa. We have decided to discontinue Celexa. I recommended seeking counseling. She will call after the school year is done. I am also going to start her on Paxil.  The following medications were removed from the medication list:    Venlafaxine Hcl 75 Mg Xr24h-cap (Venlafaxine hcl) .[facility]... 1 by mouth daily (failure, zoloft, celexa, buspar, klonopin) Her updated medication list for this problem includes:    Clonazepam 1 Mg Tabs (Clonazepam) .[facility]... 1 by mouth two times a day as needed anxiety    Paroxetine Hcl 25 Mg Xr24h-tab (Paroxetine hcl) .[facility]... 1 by mouth daily  Complete Medication List: 1)  Pre-natal Tabs (Prenatal multivit-min-fe-fa) .... One a day 2)  Clonazepam 1 Mg Tabs (Clonazepam) .[facility].. 1 by mouth two times a day as needed anxiety 3)  Paroxetine Hcl 25 Mg Xr24h-tab (Paroxetine hcl) .[facility].. 1 by mouth daily  Patient Instructions: 1)  f/u 1 month Prescriptions: PAROXETINE HCL 25 MG XR24H-TAB (PAROXETINE HCL) 1 by mouth daily  #30 x 5   Entered and Authorized by:   Hannah Beat MD   Signed by:   Hannah Beat MD on 10/04/2009   Method used:   Print then Give to Patient   RxID:   [id number]

## 2010-06-28 NOTE — Progress Notes (Signed)
Summary: ? Effexor  Phone Note Call from Patient Call back at [phone]   Caller: Patient Call For: Hannah Beat MD Summary of Call: Patient has been taking Effexor about 3 weeks and she can not take it. Patient complains of stomach cramps, gas pain, heatburn and it does not take much to make her cry. Patient wants to know if you can switch her to something else? Patient has tried Paxil and Celexa and had problem with both. [facility]  Patient is aware that Dr. Patsy Lager is out of the office today. Initial call taken by: Sydell Axon LPN,  February 01, 2010 9:35 AM  Follow-up for Phone Call        Stop medicaiton[facility]ait return of [facility] tommorow for his input on med choice. Follow-up by: Kerby Nora MD,  February 01, 2010 10:22 AM  Additional Follow-up for Phone Call Additional follow up Details #1::        Patient advised as instructed via telephone.  Will wait to hear from [place] tomorrow regarding what medication to start. Additional Follow-up by: Linde Gillis CMA Duncan Dull),  February 01, 2010 10:24 AM    Additional Follow-up for Phone Call Additional follow up Details #2::    discussed. d/c effexor  start some prozac, low dose and then f/u 1 mo Follow-up by: Hannah Beat MD,  February 01, 2010 12:17 PM  New/Updated Medications: FLUOXETINE HCL 10 MG CAPS (FLUOXETINE HCL) 1 by mouth daily Prescriptions: FLUOXETINE HCL 10 MG CAPS (FLUOXETINE HCL) 1 by mouth daily  #30 x 3   Entered and Authorized by:   Hannah Beat MD   Signed by:   Hannah Beat MD on 02/01/2010   Method used:   Electronically to        [facility]       [address]       [place], [place]  [address]       Ph: [phone]       Fax: [phone]   RxID:   [id number]

## 2010-06-28 NOTE — Letter (Signed)
Summary: [facility]   Imported By: Lanelle Bal 01/04/2010 10:56:27  _____________________________________________________________________  External Attachment:    Type:   Image     Comment:   External Document

## 2010-06-28 NOTE — Letter (Signed)
Summary: [facility]  [facility]   Imported By: Maryln Gottron 02/25/2010 10:43:31  _____________________________________________________________________  External Attachment:    Type:   Image     Comment:   External Document

## 2010-06-30 NOTE — Assessment & Plan Note (Signed)
Summary: DISCUSS HEADACHES/CLE   Vital Signs:  Patient profile:   30 year old female Height:      67 inches Weight:      143.50 pounds BMI:     22.56 Temp:     98.0 degrees F oral Pulse rate:   76 / minute Pulse rhythm:   regular BP sitting:   110 / 80  (left arm) Cuff size:   regular  Vitals Entered By: Benny Lennert CMA Duncan Dull) (June 16, 2010 3:44 PM)  History of Present Illness: Chief complaint discuss headaches  30 year old:  headaches, always has had a couple of times a month, had them a lot with pregnancy. Not related to menses. Called OB Dentist  can be either sides.  sometimes will get some nausea Light and sound may bother her a little bit.  start posterior, nape of neck occipital headache. - then can spread to top and around head.  working out three or four times a week.   8 hours. Ice pack will make it feel better.   anxiety and depression, doing better, better with exercise. much better than before, more stable on current medication regiment. Worried about upcoming flying.  Allergies: 1)  ! Amoxicillin  Past History:  Past medical, surgical, family and social histories (including risk factors) reviewed, and no changes noted (except as noted below).  Past Medical History: Reviewed history from 12/21/2009 and no changes required. ANXIETY  NEPHROLITHIASIS, HX OF ANEMIA (h/o)  Past Surgical History: Reviewed history from 12/29/2008 and no changes required. none  Family History: Reviewed history from 12/29/2008 and no changes required. Family History of Alcoholism/Addiction Family History of Arthritis Family History Depression Family History High cholesterol Family History of Stroke F 1st degree relative <60 Family History of Stroke M 1st degree relative <50  Social History: Reviewed history from 11/06/2009 and no changes required. Occupation: Clinical biochemist Married Alcohol use-yes Drug use-no Regular exercise-yes  Review of  Systems       REVIEW OF SYSTEMS GEN: No acute illnesses, no fever, chills, sweats. CV: No chest pain or SOB GI: No noted N or V Otherwise, pertinent positives and negatives are noted in the HPI.   Physical Exam  General:  Well-developed,well-nourished,in no acute distress; alert,appropriate and cooperative throughout examination Head:  Normocephalic and atraumatic without obvious abnormalities. No apparent alopecia or balding. Eyes:  pupils equal, pupils round, and pupils reactive to light.   Ears:  no external deformities.   Nose:  no external deformity.   Neck:  No deformities, masses, or tenderness noted. Lungs:  normal respiratory effort.   Psych:  Cognition and judgment appear intact. Alert and cooperative with normal attention span and concentration. No apparent delusions, illusions, hallucinations   Detailed Neurologic Exam  Speech:    Speech is normal; fluent and spontaneous with normal comprehension Cognition:    The patient is oriented to person, place, and time; memory intact; language fluent; normal attention, concentration, and fund of knowledge Cranial Nerves:    The pupils are equal, round, and reactive to light. The fundi are normal and spontaneous venous pulsations are present. Visual fields are full to finger confrontation. Extraocular movements are intact. Trigeminal sensation is intact and the muscles of mastication are normal. The face is symmetric. The palate elevates in the midline. Voice is normal. Shoulder shrug is normal. The tongue has normal motion without fasciculations.  Gait:    grossly normal Trapezius:    No tightness or tenderness noted.  Observation:  No asymmetry, no atrophy, and no involuntary movements noted.   Tone:    Normal muscle tone.  Posture:    Posture is normal.  Strength:    Strength is V/V in the upper and lower limbs.  Light Touch:    Normal light touch sensation in upper and lower extremities.    Impression &  Recommendations:  Problem # 1:  TENSION HEADACHE (ICD-307.81) Most c/w tension ha, classic posterior nape of neck, occipital some migraine features, nausea, no aura, rare photophobia  advil, tylenol, and caffeine at onset  if this fails, ok to use fiorecet  Her updated medication list for this problem includes:    Butalbital-apap-caffeine 50-325-40 Mg Tabs (Butalbital-apap-caffeine) .[facility]... 1-2 by mouth q 4 hours as needed headache (max 8 / 24 hours)  Problem # 2:  DEPRESSIVE DISORDER (ICD-311) Assessment: Improved  Her updated medication list for this problem includes:    Lorazepam 1 Mg Tabs (Lorazepam) .[facility]... 1 by mouth two times a day as needed    Bupropion Hcl 300 Mg Xr24h-tab (Bupropion hcl) .[facility]... 1 by mouth daily  Problem # 3:  ANXIETY (ICD-300.00) Assessment: Improved doing better with flight, ativan x 1 when in seat, may repeat x 1  Her updated medication list for this problem includes:    Lorazepam 1 Mg Tabs (Lorazepam) .[facility]... 1 by mouth two times a day as needed    Bupropion Hcl 300 Mg Xr24h-tab (Bupropion hcl) .[facility]... 1 by mouth daily  Complete Medication List: 1)  Lorazepam 1 Mg Tabs (Lorazepam) .[facility].. 1 by mouth two times a day as needed 2)  Bupropion Hcl 300 Mg Xr24h-tab (Bupropion hcl) .[facility].. 1 by mouth daily 3)  Valacyclovir Hcl 1 Gm Tabs (Valacyclovir hcl) .... Use as directed 4)  Butalbital-apap-caffeine 50-325-40 Mg Tabs (Butalbital-apap-caffeine) .[facility].. 1-2 by mouth q 4 hours as needed headache (max 8 / 24 hours) Prescriptions: BUTALBITAL-APAP-CAFFEINE 50-325-40 MG TABS (BUTALBITAL-APAP-CAFFEINE) 1-2 by mouth q 4 hours as needed headache (max 8 / 24 hours)  #30 x 3   Entered and Authorized by:   Hannah Beat MD   Signed by:   Hannah Beat MD on 06/16/2010   Method used:   Print then Give to Patient   RxID:   [id number] VALACYCLOVIR HCL 1 GM TABS (VALACYCLOVIR HCL) use as directed  #30 x 5   Entered and Authorized by:   Hannah Beat MD   Signed by:    Hannah Beat MD on 06/16/2010   Method used:   Electronically to        [facility]       [address]       [place], [place]  [address]       Ph: [phone]       Fax: [phone]   RxID:   [id number]    Orders Added: 1)  Est. Patient Level IV [[address]]    Current Allergies (reviewed today): ! AMOXICILLIN

## 2010-07-27 ENCOUNTER — Encounter: Payer: Self-pay | Admitting: Family Medicine

## 2010-08-01 ENCOUNTER — Telehealth (INDEPENDENT_AMBULATORY_CARE_PROVIDER_SITE_OTHER): Payer: Self-pay | Admitting: *Deleted

## 2010-08-08 ENCOUNTER — Telehealth: Payer: Self-pay | Admitting: Family Medicine

## 2010-08-08 DIAGNOSIS — G43009 Migraine without aura, not intractable, without status migrainosus: Secondary | ICD-10-CM | POA: Insufficient documentation

## 2010-08-08 HISTORY — DX: Migraine without aura, not intractable, without status migrainosus: G43.009

## 2010-08-09 NOTE — Letter (Signed)
Summary: [facility]   Imported By: Kassie Mends 08/02/2010 09:00:48  _____________________________________________________________________  External Attachment:    Type:   Image     Comment:   External Document
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
## 2010-08-16 NOTE — Progress Notes (Signed)
Summary: wants referral to headache clinic  Phone Note Call from Patient Call back at Home Phone [phone]   Caller: Patient Call For: Beth Beat MD Summary of Call: Pt wants to go to headache clinic in [place], but she will need a referral.  She says she has discussed her headaches with you before and had gone to headache clinic in [place] once, but she wasnt  comfortable with her care there.              [name] CMA, AAMA  August 08, 2010 10:11 AM   Follow-up for Phone Call        I am fine with that -- I saw HA clinic note from a few weeks ago.  There will be a longer wait at [facility], [place], but that is a very reasonable option. Beth Beat MD  August 08, 2010 5:54 PM   New Problems: COMMON MIGRAINE (ICD-346.10)   New Problems: COMMON MIGRAINE (ICD-346.10)

## 2010-08-24 ENCOUNTER — Other Ambulatory Visit: Payer: Self-pay | Admitting: *Deleted

## 2010-08-24 DIAGNOSIS — F419 Anxiety disorder, unspecified: Secondary | ICD-10-CM

## 2010-08-24 DIAGNOSIS — F32A Depression, unspecified: Secondary | ICD-10-CM

## 2010-08-24 NOTE — Telephone Encounter (Signed)
Ok to refill with 1 refill 

## 2010-08-25 MED ORDER — LORAZEPAM 1 MG PO TABS: ORAL_TABLET | ORAL | Status: DC

## 2010-08-25 NOTE — Telephone Encounter (Signed)
Rx called to pharmacy

## 2010-09-06 ENCOUNTER — Encounter: Payer: Self-pay | Admitting: Family Medicine

## 2010-09-08 ENCOUNTER — Other Ambulatory Visit: Payer: Self-pay | Admitting: *Deleted

## 2010-09-08 ENCOUNTER — Encounter: Payer: Self-pay | Admitting: Family Medicine

## 2010-09-08 ENCOUNTER — Ambulatory Visit (INDEPENDENT_AMBULATORY_CARE_PROVIDER_SITE_OTHER): Payer: BC Managed Care – PPO | Admitting: Family Medicine

## 2010-09-08 DIAGNOSIS — G44209 Tension-type headache, unspecified, not intractable: Secondary | ICD-10-CM

## 2010-09-08 DIAGNOSIS — F411 Generalized anxiety disorder: Secondary | ICD-10-CM

## 2010-09-08 DIAGNOSIS — F329 Major depressive disorder, single episode, unspecified: Secondary | ICD-10-CM

## 2010-09-08 DIAGNOSIS — F3289 Other specified depressive episodes: Secondary | ICD-10-CM

## 2010-09-08 MED ORDER — VILAZODONE HCL 20 MG PO TABS: 1.0000 | ORAL_TABLET | Freq: Every day | ORAL | Status: DC

## 2010-09-08 MED ORDER — VILAZODONE HCL 10 MG PO TABS: 1.0000 | ORAL_TABLET | Freq: Every day | ORAL | Status: DC

## 2010-09-08 NOTE — Telephone Encounter (Signed)
Patient called to see if

## 2010-09-08 NOTE — Telephone Encounter (Signed)
I am OK with that, can you call in Viibryd 20 mg daily, use as directed, #30, 3 refills

## 2010-09-08 NOTE — Telephone Encounter (Signed)
She could get her viibryd in 20mg  instead of 10 and just cut them in half to last longer to save her money. Please advise.

## 2010-09-08 NOTE — Patient Instructions (Signed)
F/u 6 weeks  CALL IN 2-3 weeks, let me know if you are tolerating OK, and we can potentially increase dose

## 2010-09-08 NOTE — Progress Notes (Deleted)
  Subjective:    Patient ID: Beth Long, female    DOB: 06-26-1980, 30 y.o.   MRN: [id number]  HPI   Review of Systems     Objective:   Physical Exam        Assessment & Plan:

## 2010-09-08 NOTE — Progress Notes (Signed)
30 year old female:  Daily neck pain and headaches. She is having daily headaches and she is taking Wellbutrin 300 mg, and when she decreases to 150 mg, the patient's headaches actually dramatically improved. Now she is having a headache only very rarely.  Lowered wellbutrin, HA better, but now with worsening of her anxiety and she is feeling anxiety attacks and panic attacks essentially daily.  Almost daily, mild anx almost daily  She has been on a multitude of antidepressants including Paxil, Prozac, Celexa, Effexor, and was not really able to tolerate all of them.  The PMH, PSH, Social History, Family History, Medications, and allergies have been reviewed in [facility], and have been updated if relevant.  GEN: WDWN, NAD, Non-toxic, A & O x 3 HEENT: Atraumatic, Normocephalic. Neck supple. No masses, No LAD. Ears and Nose: No external deformity. EXTR: No c/c/e NEURO Normal gait.  PSYCH: Normally interactive. Conversant. Not depressed or anxious appearing.  Calm demeanor.

## 2010-10-25 ENCOUNTER — Ambulatory Visit (INDEPENDENT_AMBULATORY_CARE_PROVIDER_SITE_OTHER): Payer: BC Managed Care – PPO | Admitting: Family Medicine

## 2010-10-25 ENCOUNTER — Encounter: Payer: Self-pay | Admitting: Family Medicine

## 2010-10-25 VITALS — BP 110/70 | HR 88 | Temp 98.6°F | Resp 20 | Wt 140.0 lb

## 2010-10-25 DIAGNOSIS — B349 Viral infection, unspecified: Secondary | ICD-10-CM | POA: Insufficient documentation

## 2010-10-25 DIAGNOSIS — B9789 Other viral agents as the cause of diseases classified elsewhere: Secondary | ICD-10-CM

## 2010-10-25 DIAGNOSIS — J029 Acute pharyngitis, unspecified: Secondary | ICD-10-CM

## 2010-10-25 LAB — POCT RAPID STREP A (OFFICE): Rapid Strep A Screen: NEGATIVE

## 2010-10-25 NOTE — Patient Instructions (Signed)
Drink plenty of fluids, take tylenol as needed, and gargle with warm salt water for your throat.  This should gradually improve.  Take care.  Let us know if you have other concerns.    

## 2010-10-25 NOTE — Assessment & Plan Note (Addendum)
Likely benign viral process.  Supportive tx and fu prn. Okay for outpatient fu.  She agrees with plan.

## 2010-10-25 NOTE — Progress Notes (Signed)
duration of symptoms: Stated Saturday with ear and throat pain.  rhinorrhea:no Congestion: no ear pain: yes sore throat: yes Cough: no Myalgias:yes, diffuse other concerns: some vertigo and nausea.  Her child was recently sick.   Temp 101.4 over the weekend.  Some diarrhea today.  Last UOP at 2PM, darker than normal but w/o dysuria.    ROS: See HPI.  Otherwise negative.    Meds, vitals, and allergies reviewed.   GEN: nad, alert and oriented HEENT: mucous membranes moist, TM w/o erythema, nasal epithelium injected, OP with cobblestoning, no exudates. NECK: supple w/o LA CV: rrr. PULM: ctab, no inc wob ABD: soft, +bs, minimally ttp in epigastrum but not in RLQ, lower midline or LLQ EXT: no edema, skin well perfused

## 2010-10-26 ENCOUNTER — Telehealth: Payer: Self-pay | Admitting: *Deleted

## 2010-10-26 ENCOUNTER — Encounter: Payer: Self-pay | Admitting: Family Medicine

## 2010-10-26 NOTE — Telephone Encounter (Signed)
The point of the conversation was that we didn't have IV fluids at the clinic.  She didn't appear to need this as she didn't appear clinically dehydrated.  If she stops making urine or is presyncopal on standing, then she may need IVF.  If that's the case, we couldn't do it in the office and she'd need to be seen at ER.  It is likely that if she is home and tries to take frequent sips of fluid, then she could avoid this.

## 2010-10-26 NOTE — Telephone Encounter (Signed)
Will forward to Dr. Para March who evaluated patient.

## 2010-10-26 NOTE — Telephone Encounter (Signed)
Patient was seen yesterday and she says that you had mentioned an IV bag to get her hydrated. She is asking how long she should try at home before getting to that point. Please advise.

## 2010-10-26 NOTE — Telephone Encounter (Signed)
Call placed to patient at [phone], she was informed per Dr Para March instructions. She has verbalized understanding and agrees as instructed

## 2010-10-28 ENCOUNTER — Encounter: Payer: Self-pay | Admitting: Family Medicine

## 2010-10-28 ENCOUNTER — Telehealth: Payer: Self-pay | Admitting: *Deleted

## 2010-10-28 ENCOUNTER — Other Ambulatory Visit: Payer: Self-pay | Admitting: *Deleted

## 2010-10-28 NOTE — Telephone Encounter (Signed)
Rx called in and patient notified.  

## 2010-10-28 NOTE — Telephone Encounter (Signed)
Please call in the following: benadryl 12.5mg /51ml, nystatin 100,000 units/16ml, lidocaine 2%/90ml.  Mix all 3 in a 1 to 1 to 1 ratio.  5-15mL swish and swallow 4 times a day as needed. Disp , 1 rf.  Thanks.

## 2010-10-28 NOTE — Telephone Encounter (Signed)
Pt was seen a few days ago for a sore throat.  She says the fever is gone but her throat is still very sore, unable to eat or drink anything.  She is asking if magic [facility] [place].

## 2010-10-31 ENCOUNTER — Telehealth: Payer: Self-pay | Admitting: *Deleted

## 2010-10-31 NOTE — Telephone Encounter (Signed)
Patient is asking if she can get rx for vilazodone 40mg  instead of 20 so that she can cut it in half.

## 2010-10-31 NOTE — Telephone Encounter (Signed)
Yes, please send in 40 mg tabs, 1 po daily, #30, 11 refills  (she will need to remember that she is supposed to cut them in half)

## 2010-11-01 MED ORDER — VILAZODONE HCL 40 MG PO TABS: 1.0000 | ORAL_TABLET | Freq: Every day | ORAL | Status: DC

## 2010-11-01 NOTE — Telephone Encounter (Signed)
Rx sent to pharmacy, patient advised as instructed via telephone and reminded to cut the pills in half.

## 2011-02-16 ENCOUNTER — Telehealth: Payer: Self-pay | Admitting: *Deleted

## 2011-02-16 DIAGNOSIS — R5383 Other fatigue: Secondary | ICD-10-CM

## 2011-02-16 NOTE — Telephone Encounter (Signed)
Pt is asking for labs to check her thyroid.  She says her hair has been falling out.  Her mother has been on thyroid medicine for years and she is asking if the problem could be hereditary.  Please advise.

## 2011-02-17 NOTE — Telephone Encounter (Signed)
Patient would like to be checked for anemia and vitamin levels.appt made for next Thursday for labs

## 2011-02-17 NOTE — Telephone Encounter (Signed)
Patient advised and appt made

## 2011-02-17 NOTE — Telephone Encounter (Signed)
It is possible, reasonable to check. Please set up Future lab  Taking a prenatal vitamin and folate can sometimes help with hair - but will take months to see a difference.   If thyroid normal, then it is not the cause.

## 2011-02-17 NOTE — Telephone Encounter (Signed)
Done. Please have her set up f/u with me a few days after. That is a lot of labwork, and I would like to make sure we address what is going on with her and see if we can come up with some help

## 2011-02-23 ENCOUNTER — Other Ambulatory Visit (INDEPENDENT_AMBULATORY_CARE_PROVIDER_SITE_OTHER): Payer: BC Managed Care – PPO

## 2011-02-23 DIAGNOSIS — R5383 Other fatigue: Secondary | ICD-10-CM

## 2011-02-23 DIAGNOSIS — L659 Nonscarring hair loss, unspecified: Secondary | ICD-10-CM

## 2011-02-24 LAB — CBC WITH DIFFERENTIAL/PLATELET
Basophils Absolute: 0 10*3/uL (ref 0.0–0.1)
Hemoglobin: 13.2 g/dL (ref 12.0–15.0)
Lymphocytes Relative: 38.8 % (ref 12.0–46.0)
Monocytes Relative: 6.7 % (ref 3.0–12.0)
Neutrophils Relative %: 51.6 % (ref 43.0–77.0)
Platelets: 292 10*3/uL (ref 150.0–400.0)
RDW: 12.1 % (ref 11.5–14.6)

## 2011-02-24 LAB — TSH: TSH: 1.13 u[IU]/mL (ref 0.35–5.50)

## 2011-02-24 LAB — T4, FREE: Free T4: 0.92 ng/dL (ref 0.60–1.60)

## 2011-02-24 LAB — T3, FREE: T3, Free: 2.7 pg/mL (ref 2.3–4.2)

## 2011-02-24 LAB — VITAMIN B12: Vitamin B-12: 400 pg/mL (ref 211–911)

## 2011-02-24 LAB — FOLATE: Folate: 18.5 ng/mL (ref >5.9)

## 2011-02-25 LAB — VITAMIN D 25 HYDROXY (VIT D DEFICIENCY, FRACTURES): Vit D, 25-Hydroxy: 52 ng/mL (ref 30–89)

## 2011-03-01 ENCOUNTER — Ambulatory Visit (INDEPENDENT_AMBULATORY_CARE_PROVIDER_SITE_OTHER): Payer: BC Managed Care – PPO | Admitting: Family Medicine

## 2011-03-01 ENCOUNTER — Encounter: Payer: Self-pay | Admitting: Family Medicine

## 2011-03-01 VITALS — BP 102/70 | HR 64 | Temp 98.5°F | Ht 67.0 in | Wt 143.5 lb

## 2011-03-01 DIAGNOSIS — F411 Generalized anxiety disorder: Secondary | ICD-10-CM

## 2011-03-01 DIAGNOSIS — L659 Nonscarring hair loss, unspecified: Secondary | ICD-10-CM

## 2011-03-01 NOTE — Progress Notes (Signed)
  Subjective:    Patient ID: Beth Long, female    DOB: [birth year], 30 y.o.   MRN: [id number]  HPI  Beth Long, a 30 y.o. female presents today in the office for the following:    Hair is coming out. Started to notice it in June. Started to go in June. She is concerned, at baseline has some mildly thin hair, and thinks that it has been coming out approximately in June.  The only new medication that was her new antidepressant that we started in April, 2012.  CBC:    Component Value Date/Time   WBC 6.4 02/23/2011 1530   HGB 13.2 02/23/2011 1530   HCT 39.0 02/23/2011 1530   PLT 292.0 02/23/2011 1530   MCV 93.3 02/23/2011 1530   NEUTROABS 3.3 02/23/2011 1530   LYMPHSABS 2.5 02/23/2011 1530   MONOABS 0.4 02/23/2011 1530   EOSABS 0.2 02/23/2011 1530   BASOSABS 0.0 02/23/2011 1530   Basic Metabolic Panel:    Component Value Date/Time   NA 140 12/21/2009 1225   K 4.1 12/21/2009 1225   CL 103 12/21/2009 1225   CO2 32 12/21/2009 1225   BUN 13 12/21/2009 1225   CREATININE 0.6 12/21/2009 1225   GLUCOSE 81 12/21/2009 1225   CALCIUM 9.7 12/21/2009 1225   Lab Results  Component Value Date   ALT 21 12/21/2009   AST 24 12/21/2009   ALKPHOS 63 12/21/2009   BILITOT 0.8 12/21/2009   Lab Results  Component Value Date   VITAMINB12 400 02/23/2011   Lab Results  Component Value Date   TSH 1.13 02/23/2011   Vit D normal Folate normal  The PMH, PSH, Social History, Family History, Medications, and allergies have been reviewed in [facility], and have been updated if relevant.   Review of Systems ROS: GEN: No acute illnesses, no fevers, chills. GI: No n/v/d, eating normally Pulm: No SOB Interactive and getting along well at home.  Otherwise, ROS is as per the HPI.     Objective:   Physical Exam   Physical Exam  Blood pressure 102/70, pulse 64, temperature 98.5 F (36.9 C), temperature source Oral, height 5\' 7"  (1.702 m), weight 143 lb 8 oz (65.091 kg), last menstrual period 02/08/2011.  GEN:  WDWN, NAD, Non-toxic, A & O x 3 HEENT: Atraumatic, Normocephalic. Neck supple. No masses, No LAD. Ears and Nose: No external deformity. EXTR: No c/c/e NEURO Normal gait.  PSYCH: Normally interactive. Conversant. Not depressed or anxious appearing.  Calm demeanor.        Assessment & Plan:   1. Hair loss   2. ANXIETY     For hair loss, I recommended continuing with a prenatal vitamin, biotin supplements, and doing a trial of Rogaine.  She is worried that this may be coming from her antidepressant, and would like to do a trial off of this. I recommended against doing that.  I have some concerns from prior interactions and previous significant depression and anxiety. Nevertheless, the patient is able to make her own decisions, and I explained how to titrate off of this medication. Close followup if needed.

## 2011-03-01 NOTE — Patient Instructions (Addendum)
Cut Vibryyd to 10 mg - do that for 10 days, then stop.   Rogaine 5%, apply twice a day (foam)

## 2011-05-08 ENCOUNTER — Other Ambulatory Visit: Payer: Self-pay | Admitting: *Deleted

## 2011-05-08 DIAGNOSIS — F329 Major depressive disorder, single episode, unspecified: Secondary | ICD-10-CM

## 2011-05-09 MED ORDER — LORAZEPAM 1 MG PO TABS: ORAL_TABLET | ORAL | Status: DC

## 2011-05-09 NOTE — Telephone Encounter (Signed)
rx called to pharmacy 

## 2011-05-09 NOTE — Telephone Encounter (Signed)
Ok #60, 1 refill

## 2011-10-31 ENCOUNTER — Encounter: Payer: Self-pay | Admitting: Family Medicine

## 2011-10-31 ENCOUNTER — Ambulatory Visit (INDEPENDENT_AMBULATORY_CARE_PROVIDER_SITE_OTHER): Payer: BC Managed Care – PPO | Admitting: Family Medicine

## 2011-10-31 ENCOUNTER — Telehealth: Payer: Self-pay | Admitting: Family Medicine

## 2011-10-31 VITALS — BP 110/60 | HR 62 | Temp 98.5°F | Ht 67.0 in | Wt 150.4 lb

## 2011-10-31 DIAGNOSIS — H109 Unspecified conjunctivitis: Secondary | ICD-10-CM

## 2011-10-31 NOTE — Progress Notes (Signed)
  Subjective:    Patient ID: Beth Long, female    DOB: Jan 07, 1981, 31 y.o.   MRN: [id number]  Conjunctivitis  The current episode started today (son with pick eye last week, she had pink eye in left eye last week as well, used old drops, returned in right eye today). The problem is moderate. Associated symptoms include eye itching, muscle aches, rash, eye discharge, eye pain and eye redness. Pertinent negatives include no fever, no decreased vision, no double vision, no photophobia, no congestion, no ear discharge, no ear pain, no headaches, no hearing loss, no rhinorrhea, no sore throat, no swollen glands, no cough and no URI. The eye pain is mild (more itching than pain). There is pain in the right eye. The eyelid exhibits no abnormality.      Review of Systems  Constitutional: Negative for fever.  HENT: Negative for hearing loss, ear pain, congestion, sore throat, rhinorrhea and ear discharge.   Eyes: Positive for pain, discharge, redness and itching. Negative for double vision and photophobia.  Respiratory: Negative for cough.   Skin: Positive for rash.  Neurological: Negative for headaches.       Objective:   Physical Exam  Constitutional: She appears well-developed.  HENT:  Head: Normocephalic.  Right Ear: External ear normal.  Left Ear: External ear normal.  Mouth/Throat: Oropharynx is clear and moist.  Eyes: EOM and lids are normal. Pupils are equal, round, and reactive to light. Right eye exhibits no discharge. Left eye exhibits no discharge. Right conjunctiva is injected. Right conjunctiva has no hemorrhage. Left conjunctiva is not injected. Left conjunctiva has no hemorrhage. No scleral icterus.  Cardiovascular: Normal rate and regular rhythm.   No murmur heard. Pulmonary/Chest: Effort normal and breath sounds normal. No respiratory distress. She has no wheezes.          Assessment & Plan:

## 2011-10-31 NOTE — Telephone Encounter (Signed)
reasonable 

## 2011-10-31 NOTE — Assessment & Plan Note (Signed)
Due to viral infection. Symptomatic care. Explained no antibacterial drops needed.  Wash hands constantly as contagious, okay to return to school.

## 2011-10-31 NOTE — Telephone Encounter (Signed)
Caller: Aryel/Patient; PCP: Hannah Beat T.; CB#: [phone]. Call regarding Sxs of Pink Eye. Caller reports her child had sxs of pink eye and used gtts appx a month ago. She then developed sxs and used his gtts and her sxs cleared as well. Sxs began again in left eye, on Monday 6/2 with matting upon waking, discharge from eye, and redness. Caller asking if she can get gtts called in. Caller advised she must be seen per Practice Profile. 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 Scheduled for 11:45 today, Tues 6/4 with Dr Para March.

## 2011-11-28 ENCOUNTER — Other Ambulatory Visit: Payer: Self-pay | Admitting: Family Medicine

## 2011-12-04 ENCOUNTER — Other Ambulatory Visit: Payer: Self-pay | Admitting: *Deleted

## 2011-12-04 NOTE — Telephone Encounter (Signed)
Ok to refill #30, 0 refills 

## 2011-12-05 MED ORDER — LORAZEPAM 1 MG PO TABS: 1.0000 mg | ORAL_TABLET | Freq: Three times a day (TID) | ORAL | Status: DC

## 2011-12-05 NOTE — Telephone Encounter (Signed)
rx called to pharmacy 

## 2012-01-11 ENCOUNTER — Other Ambulatory Visit: Payer: Self-pay

## 2012-01-11 MED ORDER — LORAZEPAM 1 MG PO TABS: 1.0000 mg | ORAL_TABLET | Freq: Three times a day (TID) | ORAL | Status: DC

## 2012-01-11 NOTE — Telephone Encounter (Signed)
Ok to refill 30, 2 refills  F/u with me at her convenience in the upcoming weeks

## 2012-01-11 NOTE — Telephone Encounter (Signed)
rx called to pharmacy and patient advised 

## 2012-01-11 NOTE — Telephone Encounter (Signed)
Pt request refill lorazepam to [facility]. Pt wanted to know if could get more than one refill? Pt is going to call for appt to discuss with Dr Patsy Lager. Pt will be out of med 01/13/12.Please advise.

## 2012-04-29 ENCOUNTER — Encounter: Payer: Self-pay | Admitting: Family Medicine

## 2012-04-29 ENCOUNTER — Ambulatory Visit (INDEPENDENT_AMBULATORY_CARE_PROVIDER_SITE_OTHER): Payer: BC Managed Care – PPO | Admitting: Family Medicine

## 2012-04-29 VITALS — BP 100/60 | HR 87 | Temp 98.6°F | Ht 67.0 in | Wt 151.5 lb

## 2012-04-29 DIAGNOSIS — F329 Major depressive disorder, single episode, unspecified: Secondary | ICD-10-CM

## 2012-04-29 DIAGNOSIS — F411 Generalized anxiety disorder: Secondary | ICD-10-CM

## 2012-04-29 MED ORDER — LORAZEPAM 1 MG PO TABS: 1.0000 mg | ORAL_TABLET | Freq: Three times a day (TID) | ORAL | Status: DC

## 2012-04-29 MED ORDER — VILAZODONE HCL 40 MG PO TABS: 40.0000 mg | ORAL_TABLET | Freq: Every day | ORAL | Status: DC

## 2012-04-29 MED ORDER — VALACYCLOVIR HCL 1 G PO TABS: 2000.0000 mg | ORAL_TABLET | Freq: Two times a day (BID) | ORAL | Status: DC

## 2012-04-29 NOTE — Progress Notes (Signed)
Nature conservation officer at [facility] [address] [place] [address] Phone: [phone] Fax: [phone]  Date:  04/29/2012   Name:  Beth Long   DOB:  Oct 26, 1980   MRN:  [id number] Gender: female Age: 31 y.o.  PCP:  Hannah Beat, MD  Evaluating MD: Hannah Beat, MD   Chief Complaint: Cough and Medication Refill   History of Present Illness:  Beth Long is a 31 y.o. pleasant patient who presents with the following:  Med refills / cough:  Had a cough a few days, will come and go, every few hours. -- felt kind of sick. Last week all in nasal passage.  Heartburn has a lot worse recently.  Anxiety and depression are pretty stable, sometimes will get more anxious at the end of the day before husband gets home.   Patient Active Problem List  Diagnosis  . ANXIETY  . DEPRESSIVE DISORDER  . FATIGUE  . NEPHROLITHIASIS, HX OF  . COMMON MIGRAINE    Past Medical History  Diagnosis Date  . Anemia   . Anxiety   . History of nephrolithiasis     No past surgical history on file.  History  Substance Use Topics  . Smoking status: Former Games developer  . Smokeless tobacco: Not on file  . Alcohol Use: Yes    No family history on file.  Allergies  Allergen Reactions  . Amoxicillin     REACTION: rash from head to toe    Medication list has been reviewed and updated.  Outpatient Prescriptions Prior to Visit  Medication Sig Dispense Refill  . LORazepam (ATIVAN) 1 MG tablet Take 1 tablet (1 mg total) by mouth every 8 (eight) hours.  30 tablet  2  . VIIBRYD 40 MG TABS TAKE ONE TABLET BY MOUTH ONE TIME DAILY  30 tablet  10   Last reviewed on 04/29/2012  3:20 PM by Hannah Beat, MD  Review of Systems:   GEN: No acute illnesses, no fevers, chills. GI: No n/v/d, eating normally Pulm: No SOB Interactive and getting along well at home.  Otherwise, ROS is as per the HPI.   Physical Examination: Filed Vitals:   04/29/12 1513  BP: 100/60  Pulse: 87    Temp: 98.6 F (37 C)  TempSrc: Oral  Height: 5\' 7"  (1.702 m)  Weight: 151 lb 8 oz (68.72 kg)  SpO2: 98%    Body mass index is 23.73 kg/(m^2). Ideal Body Weight: Weight in (lb) to have BMI = 25: 159.3    GEN: WDWN, NAD, Non-toxic, A & O x 3 HEENT: Atraumatic, Normocephalic. Neck supple. No masses, No LAD. Ears and Nose: No external deformity. CV: RRR, No M/G/R. No JVD. No thrill. No extra heart sounds. PULM: CTA B, no wheezes, crackles, rhonchi. No retractions. No resp. distress. No accessory muscle use. EXTR: No c/c/e NEURO Normal gait.  PSYCH: Normally interactive. Conversant. Not depressed or anxious appearing.  Calm demeanor.    Assessment and Plan:  1. ANXIETY   2. DEPRESSIVE DISORDER    Refill meds Zantac or pepcid for cough - suspect from gerd  Orders Today:  No orders of the defined types were placed in this encounter.    Updated Medication List: (Includes new medications, updates to list, dose adjustments) Meds ordered this encounter  Medications  . Vilazodone HCl (VIIBRYD) 40 MG TABS    Sig: Take 1 tablet (40 mg total) by mouth daily.    Dispense:  30 tablet    Refill:  11  .  LORazepam (ATIVAN) 1 MG tablet    Sig: Take 1 tablet (1 mg total) by mouth every 8 (eight) hours.    Dispense:  30 tablet    Refill:  3  . valACYclovir (VALTREX) 1000 MG tablet    Sig: Take 2 tablets (2,000 mg total) by mouth 2 (two) times daily. Take as needed as directed    Dispense:  30 tablet    Refill:  5    Medications Discontinued: Medications Discontinued During This Encounter  Medication Reason  . VIIBRYD 40 MG TABS Reorder  . LORazepam (ATIVAN) 1 MG tablet Reorder     Hannah Beat, MD

## 2012-05-27 ENCOUNTER — Encounter: Payer: Self-pay | Admitting: Family Medicine

## 2012-05-27 ENCOUNTER — Ambulatory Visit (INDEPENDENT_AMBULATORY_CARE_PROVIDER_SITE_OTHER): Payer: BC Managed Care – PPO | Admitting: Family Medicine

## 2012-05-27 VITALS — BP 100/70 | HR 70 | Temp 97.6°F | Ht 67.0 in | Wt 151.5 lb

## 2012-05-27 DIAGNOSIS — L853 Xerosis cutis: Secondary | ICD-10-CM

## 2012-05-27 DIAGNOSIS — R635 Abnormal weight gain: Secondary | ICD-10-CM

## 2012-05-27 DIAGNOSIS — L738 Other specified follicular disorders: Secondary | ICD-10-CM

## 2012-05-27 NOTE — Progress Notes (Signed)
Nature conservation officer at [facility] [address] [place] [address] Phone: [phone] Fax: [phone]  Date:  05/27/2012   Name:  Stefanny Pieri   DOB:  10/23/1980   MRN:  [id number] Gender: female Age: 31 y.o.  PCP:  Hannah Beat, MD  Evaluating MD: Hannah Beat, MD   Chief Complaint: discuss thyroid condition   History of Present Illness:  Caitlyn Buchanan is a 31 y.o. pleasant patient who presents with the following:  Mother has low thyroid hormones, has had some low   Dry skin, hair brittle and falling out. Long menstrual cycle, IUD has not helped. As gained some weight. Now up > 150. Running --- 2-3 times a week, 2-3 weeks. Not to the gym as month.   The patient was talking to her brother, who is an oncologist, who recommended should she get her thyroid checked.  She is otherwise feeling well, and really is not having any significant complaints.   Patient Active Problem List  Diagnosis  . ANXIETY  . DEPRESSIVE DISORDER  . FATIGUE  . NEPHROLITHIASIS, HX OF  . COMMON MIGRAINE    Past Medical History  Diagnosis Date  . Anemia   . Anxiety   . History of nephrolithiasis     Past Surgical History  Procedure Date  . Breast reduction surgery     History  Substance Use Topics  . Smoking status: Former Games developer  . Smokeless tobacco: Not on file  . Alcohol Use: Yes    No family history on file.  Allergies  Allergen Reactions  . Amoxicillin     REACTION: rash from head to toe    Medication list has been reviewed and updated.  Outpatient Prescriptions Prior to Visit  Medication Sig Dispense Refill  . LORazepam (ATIVAN) 1 MG tablet Take 1 tablet (1 mg total) by mouth every 8 (eight) hours.  30 tablet  3  . [DISCONTINUED] Vilazodone HCl (VIIBRYD) 40 MG TABS Take 1 tablet (40 mg total) by mouth daily.  30 tablet  11  . valACYclovir (VALTREX) 1000 MG tablet Take 2 tablets (2,000 mg total) by mouth 2 (two) times daily. Take as needed as  directed  30 tablet  5   Last reviewed on 05/27/2012  3:15 PM by Yetta Glassman, LPN  Review of Systems:   GEN: No acute illnesses, no fevers, chills. GI: No n/v/d, eating normally Pulm: No SOB Interactive and getting along well at home.  Otherwise, ROS is as per the HPI.   Physical Examination: BP 100/70  Pulse 70  Temp 97.6 F (36.4 C) (Oral)  Ht 5\' 7"  (1.702 m)  Wt 151 lb 8 oz (68.72 kg)  BMI 23.73 kg/m2  SpO2 96%  Ideal Body Weight: Weight in (lb) to have BMI = 25: 159.3    GEN: WDWN, NAD, Non-toxic, Alert & Oriented x 3 HEENT: Atraumatic, Normocephalic.  Ears and Nose: No external deformity. EXTR: No clubbing/cyanosis/edema NEURO: Normal gait.  PSYCH: Normally interactive. Conversant. Not depressed or anxious appearing.  Calm demeanor.    Assessment and Plan:  1. Weight gain  TSH  2. Dry skin  TSH   As above, will check a thyroid level  Orders Today:  Orders Placed This Encounter  Procedures  . TSH    Updated Medication List: (Includes new medications, updates to list, dose adjustments) Meds ordered this encounter  Medications  . Vilazodone HCl (VIIBRYD) 40 MG TABS    Sig: Take 20 mg by mouth daily.  Medications Discontinued: Medications Discontinued During This Encounter  Medication Reason  . Vilazodone HCl (VIIBRYD) 40 MG TABS      Hannah Beat, MD

## 2012-05-28 LAB — TSH: TSH: 0.38 u[IU]/mL (ref 0.35–5.50)

## 2012-05-30 ENCOUNTER — Encounter: Payer: Self-pay | Admitting: *Deleted

## 2012-10-11 ENCOUNTER — Telehealth: Payer: Self-pay | Admitting: Family Medicine

## 2012-10-11 NOTE — Telephone Encounter (Signed)
Patient Information:  Caller Name: Beth Long  Phone: [phone]  Patient: Beth, Long  Gender: Female  DOB: Jun 15, 1980  Age: 32 Years  PCP: Hannah Beat (Family Practice)  Pregnant: No  Office Follow Up:  Does the office need to follow up with this patient?: No  Instructions For The Office: N/A   Symptoms  Reason For Call & Symptoms: Pt called to say she is on a 9 hour flight tomorrow to [place] and started to get nervous about flying. Would MD prescribe somethign for the flight? Pt has Ativan she can use prn. Rn advised pt this is not the right time to start a new medication that is sedating without knowing the side effects/etc in a situation where she may be required to become alert very quickly. Pt will take her ativan and Benadryl if needed. RN talked to pt about other non medication ways to calm herself.  Reviewed Health History In EMR: N/A  Reviewed Medications In EMR: N/A  Reviewed Allergies In EMR: N/A  Reviewed Surgeries / Procedures: N/A  Date of Onset of Symptoms: 10/11/2012 OB / GYN:  LMP: Unknown  Guideline(s) Used:  No Protocol Available - Information Only  Disposition Per Guideline:   Home Care  Reason For Disposition Reached:   Information only question and nurse able to answer  Advice Given:  Call Back If:  New symptoms develop  You become worse.  Patient Will Follow Care Advice:  YES

## 2012-10-14 NOTE — Telephone Encounter (Signed)
Call  Ativan should be fine. I would take when near take-off, buckled up, etc. Could repeat it in the air if needed.  Have a good trip   Hannah Beat, MD 10/14/2012, 1:39 PM

## 2012-10-14 NOTE — Telephone Encounter (Signed)
Patient already took flight

## 2012-12-13 ENCOUNTER — Other Ambulatory Visit: Payer: Self-pay | Admitting: *Deleted

## 2012-12-13 MED ORDER — LORAZEPAM 1 MG PO TABS: 1.0000 mg | ORAL_TABLET | Freq: Three times a day (TID) | ORAL | Status: DC

## 2012-12-13 NOTE — Telephone Encounter (Signed)
Last filled 10/22/12

## 2012-12-13 NOTE — Telephone Encounter (Signed)
Ok to refill #30, 3 refills

## 2012-12-13 NOTE — Telephone Encounter (Signed)
rx called to pharmacy 

## 2013-06-12 ENCOUNTER — Other Ambulatory Visit: Payer: Self-pay | Admitting: Family Medicine

## 2013-06-12 NOTE — Telephone Encounter (Signed)
Beth DikeJennifer notified to schedule a check up during spring or summer break.

## 2013-06-12 NOTE — Telephone Encounter (Signed)
Ok to refill 30, 5 refills  I would tell her to follow-up during spring break or summer break to check up. (i think gyn does her paps and everything.)

## 2013-06-12 NOTE — Telephone Encounter (Signed)
Last office visit 05/27/2012.  Ok to refill?

## 2013-07-21 ENCOUNTER — Other Ambulatory Visit: Payer: Self-pay | Admitting: Family Medicine

## 2013-07-21 NOTE — Telephone Encounter (Signed)
Ok to follow-up around spring break or summer break. I haven't seen her in a while - help set appt  Ok, #30, 2 refills

## 2013-07-21 NOTE — Telephone Encounter (Signed)
Last office visit 05/17/2012.  Last refilled 12/13/2012 for #30 with 3 refills.  Ok to refill?

## 2013-07-21 NOTE — Telephone Encounter (Signed)
Called to [facility] [address]  Stann MainlandWill forward note to [facility] LPJamie up front to call and schedule appointment.

## 2013-08-21 ENCOUNTER — Ambulatory Visit (INDEPENDENT_AMBULATORY_CARE_PROVIDER_SITE_OTHER): Payer: BC Managed Care – PPO | Admitting: Family Medicine

## 2013-08-21 ENCOUNTER — Encounter: Payer: Self-pay | Admitting: Family Medicine

## 2013-08-21 VITALS — BP 106/80 | HR 83 | Temp 98.5°F | Ht 67.0 in | Wt 154.5 lb

## 2013-08-21 DIAGNOSIS — J029 Acute pharyngitis, unspecified: Secondary | ICD-10-CM | POA: Insufficient documentation

## 2013-08-21 LAB — POCT RAPID STREP A (OFFICE): Rapid Strep A Screen: NEGATIVE

## 2013-08-21 NOTE — Progress Notes (Signed)
   Subjective:    Patient ID: Beth Long, female    DOB: 03/26/1981, 33 y.o.   MRN: [id number]  Sore Throat  This is a new problem. The current episode started in the past 7 days. The problem has been gradually worsening. Neither side of throat is experiencing more pain than the other. There has been no fever. The pain is moderate. Associated symptoms include ear pain, swollen glands and trouble swallowing. Pertinent negatives include no congestion, coughing, diarrhea, ear discharge, hoarse voice, plugged ear sensation, shortness of breath or vomiting. She has had no exposure to strep or mono. 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 She has tried nothing for the symptoms.      Review of Systems  Constitutional: Positive for fatigue.  HENT: Positive for ear pain and trouble swallowing. Negative for congestion, ear discharge and hoarse voice.   Eyes: Negative for pain.  Respiratory: Negative for cough and shortness of breath.   Cardiovascular: Negative for chest pain.  Gastrointestinal: Negative for vomiting and diarrhea.       Objective:   Physical Exam  Constitutional: Vital signs are normal. She appears well-developed and well-nourished. She is cooperative.  Non-toxic appearance. She does not appear ill. No distress.  HENT:  Head: Normocephalic.  Right Ear: Hearing, tympanic membrane, external ear and ear canal normal. Tympanic membrane is not erythematous, not retracted and not bulging.  Left Ear: Hearing, tympanic membrane, external ear and ear canal normal. Tympanic membrane is not erythematous, not retracted and not bulging.  Nose: No mucosal edema or rhinorrhea. Right sinus exhibits no maxillary sinus tenderness and no frontal sinus tenderness. Left sinus exhibits no maxillary sinus tenderness and no frontal sinus tenderness.  Mouth/Throat: Uvula is midline and mucous membranes are normal. Posterior oropharyngeal erythema present. No oropharyngeal exudate, posterior oropharyngeal  edema or tonsillar abscesses.  Eyes: Conjunctivae, EOM and lids are normal. Pupils are equal, round, and reactive to light. Lids are everted and swept, no foreign bodies found.  Neck: Trachea normal and normal range of motion. Neck supple. Carotid bruit is not present. No mass and no thyromegaly present.  Cardiovascular: Normal rate, regular rhythm, S1 normal, S2 normal, normal heart sounds, intact distal pulses and normal pulses.  Exam reveals no gallop and no friction rub.   No murmur heard. Pulmonary/Chest: Effort normal and breath sounds normal. Not tachypneic. No respiratory distress. She has no decreased breath sounds. She has no wheezes. She has no rhonchi. She has no rales.  Neurological: She is alert.  Skin: Skin is warm, dry and intact. No rash noted.  Psychiatric: Her speech is normal and behavior is normal. Judgment normal. Her mood appears not anxious. Cognition and memory are normal. She does not exhibit a depressed mood.          Assessment & Plan:

## 2013-08-21 NOTE — Assessment & Plan Note (Signed)
Supportive care. Rapid strep neg.

## 2013-08-21 NOTE — Progress Notes (Signed)
Pre visit review using our clinic review tool, if applicable. No additional management support is needed unless otherwise documented below in the visit note. 

## 2013-08-21 NOTE — Patient Instructions (Signed)
Ibuprofen 800 mg every 8 hours for sore throat pain.  Rest, fluids.  Call if new fever, increasing unilateral pain, or not improving in 5-7 more days.

## 2013-10-22 ENCOUNTER — Encounter: Payer: Self-pay | Admitting: Family Medicine

## 2013-10-22 ENCOUNTER — Ambulatory Visit (INDEPENDENT_AMBULATORY_CARE_PROVIDER_SITE_OTHER): Payer: BC Managed Care – PPO | Admitting: Family Medicine

## 2013-10-22 VITALS — BP 122/79 | HR 83 | Temp 98.3°F | Ht 67.0 in | Wt 157.8 lb

## 2013-10-22 DIAGNOSIS — R5381 Other malaise: Secondary | ICD-10-CM

## 2013-10-22 DIAGNOSIS — Z0000 Encounter for general adult medical examination without abnormal findings: Secondary | ICD-10-CM

## 2013-10-22 DIAGNOSIS — R5383 Other fatigue: Secondary | ICD-10-CM

## 2013-10-22 DIAGNOSIS — Z131 Encounter for screening for diabetes mellitus: Secondary | ICD-10-CM

## 2013-10-22 MED ORDER — VILAZODONE HCL 40 MG PO TABS: ORAL_TABLET | ORAL | Status: DC

## 2013-10-22 NOTE — Progress Notes (Signed)
[address] [place] [address] Phone: [phone] Fax: [phone]  Patient ID: Beth CairoJennifer Vences MRN: [id number], DOB: 12/17/1980, 33 y.o. Date of Encounter: 10/22/2013  Primary Physician:  Hannah BeatSpencer Jolisa Intriago, MD   Chief Complaint: Annual Exam  Subjective:   History of Present Illness:  Beth Long is a 33 y.o. pleasant patient who presents with the following:  She is here for a complete examination. She is basically doing completely well.  She has had some history with depression and anxiety in the past, but she has done very well with her current medications, and she rarely takes Ativan now.  Work is going well, her family is going well, and she is going to get out for summer vacation soon.  Her gynecologist does her Pap smears, breast exams. There is no early history of breast cancers in the family.  Weird stomach cramps, once every month or two and it will come back and not related to anything.   Patient Active Problem List   Diagnosis Date Noted  . COMMON MIGRAINE 08/08/2010  . DEPRESSIVE DISORDER 04/04/2010  . FATIGUE 12/21/2009  . ANXIETY 12/29/2008  . NEPHROLITHIASIS, HX OF 12/29/2008   Past Medical History  Diagnosis Date  . Anemia   . Anxiety   . History of nephrolithiasis    Past Surgical History  Procedure Laterality Date  . Breast reduction surgery     History   Social History  . Marital Status: Married    Spouse Name: N/A    Number of Children: N/A  . Years of Education: N/A   Occupational History  . school counsler [employer]   Social History Main Topics  . Smoking status: Former Games developermoker  . Smokeless tobacco: Never Used  . Alcohol Use: Yes  . Drug Use: No  . Sexual Activity: Not on file   Other Topics Concern  . Not on file   Social History Narrative   Regular exercise-yes   No family history on file. Allergies  Allergen Reactions  . Amoxicillin     REACTION: rash from head to toe   Medication list has  been reviewed and updated.  Review of Systems:  General: Denies fever, chills, sweats. No significant weight loss. Eyes: Denies blurring,significant itching ENT: Denies earache, sore throat, and hoarseness.  Cardiovascular: Denies chest pains, palpitations, dyspnea on exertion,  Respiratory: Denies cough, dyspnea at rest,wheeezing Breast: no concerns about lumps GI: as above GU: Denies dysuria, hematuria, urinary hesitancy, nocturia, denies STD risk, no concerns about discharge Musculoskeletal: Denies back pain, joint pain Derm: Denies rash, itching Neuro: Denies  paresthesias, frequent falls, frequent headaches Psych: Denies depression, anxiety Endocrine: Denies cold intolerance, heat intolerance, polydipsia Heme: Denies enlarged lymph nodes Allergy: No hayfever  Objective:   Physical Examination: BP 122/79  Pulse 83  Temp(Src) 98.3 F (36.8 C) (Oral)  Ht 5\' 7"  (1.702 m)  Wt 157 lb 12 oz (71.555 kg)  BMI 24.70 kg/m2   GEN: WDWN, NAD, Non-toxic, A & O x 3 HEENT: Atraumatic, Normocephalic. Neck supple. No masses, No LAD. Ears and Nose: No external deformity. CV: RRR, No M/G/R. No JVD. No thrill. No extra heart sounds. PULM: CTA B, no wheezes, crackles, rhonchi. No retractions. No resp. distress. No accessory muscle use. ABD: S, NT, ND, + BS, No rebound, No HSM  EXTR: No c/c/e NEURO Normal gait.  PSYCH: Normally interactive. Conversant. Not depressed or anxious appearing.  Calm demeanor.   Laboratory and Imaging Data:  Assessment & Plan:  Routine general medical examination at a health care facility  Other malaise and fatigue - Plan: Basic metabolic panel, CBC with Differential, Hepatic function panel, TSH  Screening for diabetes mellitus - Plan: Basic metabolic panel  The patient's preventative maintenance and recommended screening tests for an annual wellness exam were reviewed in full today. Brought up to date unless services declined.  Counselled on the  importance of diet, exercise, and its role in overall health and mortality. The patient's FH and SH was reviewed, including their home life, tobacco status, and drug and alcohol status.   Doing very well. Refilled medications. Check basic labs.  Follow-up: No Follow-up on file. Unless noted above, the patient is to follow-up if symptoms worsen. Red flags were reviewed with the patient.  New Prescriptions   No medications on file   Orders Placed This Encounter  Procedures  . Basic metabolic panel  . CBC with Differential  . Hepatic function panel  . TSH    Signed,  Karleen Hampshire T. Antonetta Clanton, MD, CAQ Sports Medicine   Patient's Medications  New Prescriptions   No medications on file  Previous Medications   LORAZEPAM (ATIVAN) 1 MG TABLET    take 1 tablet by mouth every 8 hours as needed  Modified Medications   Modified Medication Previous Medication   VALACYCLOVIR (VALTREX) 1000 MG TABLET valACYclovir (VALTREX) 1000 MG tablet      Take 2,000 mg by mouth as needed.    Take 2 tablets (2,000 mg total) by mouth 2 (two) times daily. Take as needed as directed   VILAZODONE HCL (VIIBRYD) 40 MG TABS VIIBRYD 40 MG TABS      Take one tablet by mouth one time daily    Take one tablet by mouth one time daily  Discontinued Medications   No medications on file

## 2013-10-22 NOTE — Progress Notes (Signed)
Pre visit review using our clinic review tool, if applicable. No additional management support is needed unless otherwise documented below in the visit note. 

## 2013-10-23 ENCOUNTER — Encounter: Payer: Self-pay | Admitting: *Deleted

## 2013-10-23 LAB — CBC WITH DIFFERENTIAL/PLATELET
BASOS ABS: 0 10*3/uL (ref 0.0–0.1)
Basophils Relative: 0.3 % (ref 0.0–3.0)
EOS ABS: 0.2 10*3/uL (ref 0.0–0.7)
Eosinophils Relative: 3.2 % (ref 0.0–5.0)
HCT: 39 % (ref 36.0–46.0)
HEMOGLOBIN: 13.6 g/dL (ref 12.0–15.0)
LYMPHS PCT: 35.1 % (ref 12.0–46.0)
Lymphs Abs: 2.6 10*3/uL (ref 0.7–4.0)
MCHC: 34.9 g/dL (ref 30.0–36.0)
MCV: 91.8 fl (ref 78.0–100.0)
Monocytes Absolute: 0.4 10*3/uL (ref 0.1–1.0)
Monocytes Relative: 5.3 % (ref 3.0–12.0)
NEUTROS ABS: 4.1 10*3/uL (ref 1.4–7.7)
NEUTROS PCT: 56.1 % (ref 43.0–77.0)
PLATELETS: 351 10*3/uL (ref 150.0–400.0)
RBC: 4.24 Mil/uL (ref 3.87–5.11)
RDW: 13.6 % (ref 11.5–15.5)
WBC: 7.4 10*3/uL (ref 4.0–10.5)

## 2013-10-23 LAB — HEPATIC FUNCTION PANEL
ALBUMIN: 3.8 g/dL (ref 3.5–5.2)
ALK PHOS: 45 U/L (ref 39–117)
ALT: 20 U/L (ref 0–35)
AST: 27 U/L (ref 0–37)
BILIRUBIN TOTAL: 0.3 mg/dL (ref 0.2–1.2)
Bilirubin, Direct: 0 mg/dL (ref 0.0–0.3)
Total Protein: 7.4 g/dL (ref 6.0–8.3)

## 2013-10-23 LAB — BASIC METABOLIC PANEL
BUN: 14 mg/dL (ref 6–23)
CALCIUM: 9.5 mg/dL (ref 8.4–10.5)
CO2: 27 meq/L (ref 19–32)
CREATININE: 0.9 mg/dL (ref 0.4–1.2)
Chloride: 103 mEq/L (ref 96–112)
GFR: 78.61 mL/min (ref >60.00)
GLUCOSE: 87 mg/dL (ref 70–99)
Potassium: 4.1 mEq/L (ref 3.5–5.1)
Sodium: 138 mEq/L (ref 135–145)

## 2013-10-23 LAB — TSH: TSH: 1.9 u[IU]/mL (ref 0.35–4.50)

## 2013-11-26 ENCOUNTER — Ambulatory Visit (INDEPENDENT_AMBULATORY_CARE_PROVIDER_SITE_OTHER): Payer: BC Managed Care – PPO | Admitting: Podiatry

## 2013-11-26 ENCOUNTER — Encounter: Payer: Self-pay | Admitting: Podiatry

## 2013-11-26 ENCOUNTER — Ambulatory Visit (INDEPENDENT_AMBULATORY_CARE_PROVIDER_SITE_OTHER): Payer: BC Managed Care – PPO

## 2013-11-26 VITALS — BP 119/83 | HR 84 | Resp 16

## 2013-11-26 DIAGNOSIS — M722 Plantar fascial fibromatosis: Secondary | ICD-10-CM

## 2013-11-26 LAB — SEDIMENTATION RATE: Sed Rate: 6 mm/hr (ref 0–32)

## 2013-11-26 MED ORDER — METHYLPREDNISOLONE (PAK) 4 MG PO TABS: ORAL_TABLET | ORAL | Status: DC

## 2013-11-26 NOTE — Progress Notes (Signed)
She presents today complaining of moderate foot pain particularly along the plantar heel and extending into the arch. She states is been present for quite some time. She is currently unable to run for exercise. She continues to wear her orthotics a regular basis.  Objective: Vital signs are stable she is alert and oriented x3. She has minimal reproducible pain to the bilateral foot today pulses remain intact. Radiographic evaluation does not demonstrate any type of osseous abnormalities other than a rectus foot type mild cavus deformity bilateral.  Assessment: Care rule out arthritic changes at this point but he very much ask like plantar fasciitis that she has no palpable tender points.  Plan: Started her on Medrol Dosepak and send her for arthritic profile. I will followup with her in 3 weeks.

## 2013-11-27 LAB — ANA: ANA: NEGATIVE

## 2013-11-27 LAB — RHEUMATOID FACTOR: Rhuematoid fact SerPl-aCnc: <7 IU/mL (ref 0.0–13.9)

## 2013-11-27 LAB — C-REACTIVE PROTEIN: CRP: 10.9 mg/L — ABNORMAL HIGH (ref 0.0–4.9)

## 2013-11-27 LAB — URIC ACID: Uric Acid: 3.8 mg/dL (ref 2.5–7.1)

## 2013-11-28 ENCOUNTER — Telehealth: Payer: Self-pay | Admitting: *Deleted

## 2013-11-28 NOTE — Telephone Encounter (Signed)
Tried to call pt regarding blood work. Her mailbox is full and could not leave message. 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

## 2013-11-28 NOTE — Telephone Encounter (Signed)
Message copied by Kirke ShaggySMITH, Jamine Wingate G on Fri Nov 28, 2013  9:22 AM ------      Message from: Ernestene KielHYATT, MAX T      Created: Thu Nov 27, 2013  4:56 PM       Blood work is ok.  CRP slightly elevated, re-eval on next visit. ------

## 2013-12-15 ENCOUNTER — Ambulatory Visit (INDEPENDENT_AMBULATORY_CARE_PROVIDER_SITE_OTHER): Payer: BC Managed Care – PPO | Admitting: Podiatry

## 2013-12-15 ENCOUNTER — Encounter: Payer: Self-pay | Admitting: Podiatry

## 2013-12-15 DIAGNOSIS — M722 Plantar fascial fibromatosis: Secondary | ICD-10-CM

## 2013-12-15 MED ORDER — DICLOFENAC SODIUM 75 MG PO TBEC: 75.0000 mg | DELAYED_RELEASE_TABLET | Freq: Two times a day (BID) | ORAL | Status: DC

## 2013-12-15 NOTE — Progress Notes (Signed)
She presents today for followup of her plantar fasciitis. She states it is doing better than it was but not as well as it could be doing. She states that she took the steroid dose pack he felt great in a she's been hiking and it has started to her once again.  Objective: Vital signs are stable she is alert and oriented x3. She has minimal pain on palpation medial continued tubercle of either heel today.  Assessment: Plantar fasciitis right heel.  Plan: A little him another pair of orthotics made for her work shoes. She will also start diclofenac twice daily.

## 2014-01-05 ENCOUNTER — Other Ambulatory Visit: Payer: Self-pay | Admitting: Family Medicine

## 2014-01-06 NOTE — Telephone Encounter (Signed)
Called to [facility] [address] 

## 2014-01-06 NOTE — Telephone Encounter (Signed)
Ok to refill 30, 3 ref 

## 2014-01-06 NOTE — Telephone Encounter (Signed)
Last office visit 10/22/2013.  Last refilled 07/21/2013 for #30 with 2 refills.  Ok to refill?

## 2014-01-12 ENCOUNTER — Ambulatory Visit (INDEPENDENT_AMBULATORY_CARE_PROVIDER_SITE_OTHER): Payer: BC Managed Care – PPO | Admitting: Podiatry

## 2014-01-12 ENCOUNTER — Encounter: Payer: Self-pay | Admitting: Podiatry

## 2014-01-12 DIAGNOSIS — M722 Plantar fascial fibromatosis: Secondary | ICD-10-CM

## 2014-01-12 MED ORDER — NABUMETONE 750 MG PO TABS: 750.0000 mg | ORAL_TABLET | Freq: Two times a day (BID) | ORAL | Status: DC

## 2014-01-12 NOTE — Progress Notes (Signed)
Pt presents for orthotics pick up written and verbal instructions given. She states that her nonsteroidal does not seem to be working.  Objective: She still has pain on palpation medial continued tubercle of the right heel only.  Assessment: Plantar fasciitis right foot. To some degree left foot as well.  Plan: Dispensed a night braces. Also changed her to Relafen.

## 2014-01-12 NOTE — Patient Instructions (Signed)

## 2014-02-09 ENCOUNTER — Ambulatory Visit (INDEPENDENT_AMBULATORY_CARE_PROVIDER_SITE_OTHER): Payer: BC Managed Care – PPO | Admitting: Podiatry

## 2014-02-09 VITALS — BP 107/77 | HR 80 | Resp 16

## 2014-02-09 DIAGNOSIS — M722 Plantar fascial fibromatosis: Secondary | ICD-10-CM

## 2014-02-09 MED ORDER — CELECOXIB 200 MG PO CAPS: 200.0000 mg | ORAL_CAPSULE | Freq: Every day | ORAL | Status: DC

## 2014-02-09 NOTE — Progress Notes (Signed)
She presents today stating that her orthotics seem to be working. She still has a lot of pain just beneath the first metatarsal medial cuneiform joint bilaterally. She's unable to take the nonsteroidal anti-inflammatories that we have prescribed secondary stomach upset.  Objective: Vital signs are stable she is alert and oriented x3. Pulses are palpable bilateral. She has pain on palpation first metatarsal me cuneiform joint. Longer has pain in the heels.  Assessment: Well-healing plantar fasciitis midfoot pain.  Procedure: Started her on Celebrex secondary to failure of a least to other nonsteroidal anti-inflammatories. She will continue use of for orthotics and we see about getting her in a pair orthotics in the near future.

## 2014-02-10 ENCOUNTER — Telehealth: Payer: Self-pay | Admitting: *Deleted

## 2014-02-10 NOTE — Telephone Encounter (Signed)
Pt called and stated the cymbalta that dr Al Corpus gave her yesterday she can not take with her other meds. i called and left pt voicemail letting her know i did not see that he wrote for cymbalta and he wrote her for celebrex and it needed prior auth and once i get the approval i will call and let her know.

## 2014-02-19 ENCOUNTER — Telehealth: Payer: Self-pay | Admitting: *Deleted

## 2014-02-19 DIAGNOSIS — M722 Plantar fascial fibromatosis: Secondary | ICD-10-CM

## 2014-02-19 NOTE — Telephone Encounter (Signed)
PATIENT CALLED WANTING TO KNOW THE STATUS OF PRIOR AUTH FOR HER MEDICATION , ALSO WANTED TO ORDER ANOTHER PAIR OF ORTHOTICS ONLY FULL LENGTH. DID NOT HEAR BACK FROM INSURANCE , REFAXED ALL INFO TO INSURANCE AND ALSO ORDERED HER A ANOTHER PAIR OF ORTHOTICS

## 2014-02-20 ENCOUNTER — Encounter: Payer: Self-pay | Admitting: Podiatry

## 2014-02-20 NOTE — Progress Notes (Signed)
Prior auth for celebrex, was approved effective 01/21/14- 02/17/15

## 2014-03-16 ENCOUNTER — Ambulatory Visit (INDEPENDENT_AMBULATORY_CARE_PROVIDER_SITE_OTHER): Payer: BC Managed Care – PPO | Admitting: Podiatry

## 2014-03-16 VITALS — BP 114/79 | HR 68 | Resp 16

## 2014-03-16 DIAGNOSIS — M722 Plantar fascial fibromatosis: Secondary | ICD-10-CM

## 2014-03-16 NOTE — Progress Notes (Signed)
She presents today states that she is approximately 70% improved bilateral plantar fasciitis. She's also to pick up her other orthotics.  Objective: Vital signs are stable she is alert and oriented x3. Minimal pain on palpation bilateral.  Assessment: Plantar fasciitis bilateral.  Plan: Continue use of the orthotics for several weeks I will followup with her as needed or in 6 weeks.

## 2014-04-27 ENCOUNTER — Ambulatory Visit: Payer: BC Managed Care – PPO | Admitting: Podiatry

## 2014-07-06 ENCOUNTER — Ambulatory Visit (INDEPENDENT_AMBULATORY_CARE_PROVIDER_SITE_OTHER): Payer: BC Managed Care – PPO | Admitting: Family Medicine

## 2014-07-06 ENCOUNTER — Encounter: Payer: Self-pay | Admitting: Family Medicine

## 2014-07-06 VITALS — BP 126/87 | HR 61 | Temp 97.6°F | Ht 67.0 in | Wt 162.0 lb

## 2014-07-06 DIAGNOSIS — R5383 Other fatigue: Secondary | ICD-10-CM

## 2014-07-06 DIAGNOSIS — F411 Generalized anxiety disorder: Secondary | ICD-10-CM

## 2014-07-06 DIAGNOSIS — Z23 Encounter for immunization: Secondary | ICD-10-CM

## 2014-07-06 DIAGNOSIS — R635 Abnormal weight gain: Secondary | ICD-10-CM

## 2014-07-06 DIAGNOSIS — L659 Nonscarring hair loss, unspecified: Secondary | ICD-10-CM

## 2014-07-06 MED ORDER — LORAZEPAM 1 MG PO TABS: 0.5000 mg | ORAL_TABLET | Freq: Three times a day (TID) | ORAL | Status: DC | PRN

## 2014-07-06 NOTE — Progress Notes (Signed)
Pre visit review using our clinic review tool, if applicable. No additional management support is needed unless otherwise documented below in the visit note. 

## 2014-07-06 NOTE — Progress Notes (Signed)
Dr. Karleen HampshireSpencer T. Rhylee Pucillo, MD, CAQ Sports Medicine Primary Care and Sports Medicine [address] [place], [address] Phone: [phone] Fax: [phone]  07/06/2014  Patient: Beth CairoJennifer Long, MRN: [id number], DOB: 02/12/1981, 34 y.o.  Primary Physician:  Beth BeatSpencer Symphani Eckstrom, MD  Chief Complaint: Medication Problem; Fatigue; Weight Gain; and Flu Vaccine  Subjective:   Beth Long is a 34 y.o. very pleasant female patient who presents with the following:  The patient comes in with a a few different complaints.  She has been having anxiety and some depression off and on for basically the entire time that I have known the patient for more than 5 years.  She is relatively stable now, and she has been taking Vibryd.  She does occasionally take some Ativan, but she has noticed that this is been making her more tired recently.  She also continues to have some fatigue, she has gained some weight over the past few years.  Her BMI is currently 25.  Her hair has also started to thin somewhat.  Overall she feels fatigued.  She is still working in the school system as a Veterinary surgeoncounselor.  She also has had some intermittent stomach discomfort, and she is trying to make some diet changes to help with this.  She saw what sounds to be like a naturopathic doctor to discuss this.   Ativan making her tired.   Abdominal pain. Hair loss, fatigue, weight gain.   Wt Readings from Last 3 Encounters:  07/06/14 162 lb (73.483 kg)  10/22/13 157 lb 12 oz (71.555 kg)  08/21/13 154 lb 8 oz (70.081 kg)      Past Medical History, Surgical History, Social History, Family History, Problem List, Medications, and Allergies have been reviewed and updated if relevant.   GEN: No acute illnesses, no fevers, chills. GI: No n/v/d, eating normally Pulm: No SOB Interactive and getting along well at home.  Otherwise, ROS is as per the HPI.  Objective:   BP 126/87 mmHg  Pulse 61  Temp(Src) 97.6 F (36.4 C) (Oral)   Ht 5\' 7"  (1.702 m)  Wt 162 lb (73.483 kg)  BMI 25.37 kg/m2  Weight in (lb) to have BMI = 25: 159.3   GEN: WDWN, NAD, Non-toxic, A & O x 3 HEENT: Atraumatic, Normocephalic. Neck supple. No masses, No LAD. Ears and Nose: No external deformity. CV: RRR, No M/G/R. No JVD. No thrill. No extra heart sounds. PULM: CTA B, no wheezes, crackles, rhonchi. No retractions. No resp. distress. No accessory muscle use. EXTR: No c/c/e NEURO Normal gait.  PSYCH: Normally interactive. Conversant. Not depressed or anxious appearing.  Calm demeanor.   Laboratory and Imaging Data:  Assessment and Plan:   Other fatigue - Plan: CBC with Differential/Platelet, Basic metabolic panel, Hepatic function panel, TSH, T3, free, T4, free, Methylmalonic Acid, Serum, Ferritin, IBC panel, Cortisol  Hair loss - Plan: CBC with Differential/Platelet, Basic metabolic panel, Hepatic function panel, TSH, T3, free, T4, free, Methylmalonic Acid, Serum, Ferritin, IBC panel, Cortisol  Weight gain - Plan: CBC with Differential/Platelet, Basic metabolic panel, Hepatic function panel, TSH, T3, free, T4, free, Methylmalonic Acid, Serum, Ferritin, IBC panel, Cortisol  GAD (generalized anxiety disorder)  Need for prophylactic vaccination and inoculation against influenza - Plan: Flu Vaccine QUAD 36+ mos IM  Decrease Ativan dose to 0.5 mg as needed.  If she still has issues with this, and we could give her a lower dose of some Klonopin instead of Ativan.  I discussed with the patient  that fatigue, weight gain, hair loss, is often multifactorial.  She has tried some Rogaine in the past without much success.  She is a little bit frustrated, but I did remind her that a BMI of 25 is certainly appropriate and within normal range.  We are going to do a little bit more of a workup for all of these issues and generalized fatigue.  Strong family history of thyroid dysfunction.   New Prescriptions   No medications on file   Orders Placed  This Encounter  Procedures  . Flu Vaccine QUAD 36+ mos IM  . CBC with Differential/Platelet  . Basic metabolic panel  . Hepatic function panel  . TSH  . T3, free  . T4, free  . Methylmalonic Acid, Serum  . Ferritin  . IBC panel  . Cortisol    Signed,  Karleen Hampshire T. Rodrecus Belsky, MD   Patient's Medications  New Prescriptions   No medications on file  Previous Medications   AVIANE 0.1-20 MG-MCG TABLET       DICLOFENAC (VOLTAREN) 75 MG EC TABLET    Take 1 tablet (75 mg total) by mouth 2 (two) times daily.   NABUMETONE (RELAFEN) 750 MG TABLET    Take 1 tablet (750 mg total) by mouth 2 (two) times daily.   VALACYCLOVIR (VALTREX) 1000 MG TABLET    Take 2,000 mg by mouth as needed.   VILAZODONE HCL (VIIBRYD) 40 MG TABS    Take one tablet by mouth one time daily  Modified Medications   Modified Medication Previous Medication   LORAZEPAM (ATIVAN) 1 MG TABLET LORazepam (ATIVAN) 1 MG tablet      Take 0.5 tablets (0.5 mg total) by mouth every 8 (eight) hours as needed.    TAKE ONE TABLET BY MOUTH EVERY EIGHT HOURS AS NEEDED   Discontinued Medications   CELECOXIB (CELEBREX) 200 MG CAPSULE    Take 1 capsule (200 mg total) by mouth daily.

## 2014-07-07 LAB — BASIC METABOLIC PANEL
BUN: 9 mg/dL (ref 6–23)
CALCIUM: 9.3 mg/dL (ref 8.4–10.5)
CHLORIDE: 102 meq/L (ref 96–112)
CO2: 26 mEq/L (ref 19–32)
CREATININE: 0.86 mg/dL (ref 0.40–1.20)
GFR: 79.35 mL/min (ref >60.00)
Glucose, Bld: 69 mg/dL — ABNORMAL LOW (ref 70–99)
Potassium: 3.8 mEq/L (ref 3.5–5.1)
Sodium: 135 mEq/L (ref 135–145)

## 2014-07-07 LAB — CBC WITH DIFFERENTIAL/PLATELET
BASOS PCT: 0.5 % (ref 0.0–3.0)
Basophils Absolute: 0 10*3/uL (ref 0.0–0.1)
EOS PCT: 2.1 % (ref 0.0–5.0)
Eosinophils Absolute: 0.2 10*3/uL (ref 0.0–0.7)
HCT: 40.4 % (ref 36.0–46.0)
Hemoglobin: 13.8 g/dL (ref 12.0–15.0)
LYMPHS PCT: 40.8 % (ref 12.0–46.0)
Lymphs Abs: 3.5 10*3/uL (ref 0.7–4.0)
MCHC: 34.3 g/dL (ref 30.0–36.0)
MCV: 91.6 fl (ref 78.0–100.0)
MONO ABS: 0.4 10*3/uL (ref 0.1–1.0)
Monocytes Relative: 5.2 % (ref 3.0–12.0)
Neutro Abs: 4.4 10*3/uL (ref 1.4–7.7)
Neutrophils Relative %: 51.4 % (ref 43.0–77.0)
Platelets: 361 10*3/uL (ref 150.0–400.0)
RBC: 4.41 Mil/uL (ref 3.87–5.11)
RDW: 13.5 % (ref 11.5–15.5)
WBC: 8.5 10*3/uL (ref 4.0–10.5)

## 2014-07-07 LAB — HEPATIC FUNCTION PANEL
ALT: 23 U/L (ref 0–35)
AST: 25 U/L (ref 0–37)
Albumin: 4.2 g/dL (ref 3.5–5.2)
Alkaline Phosphatase: 44 U/L (ref 39–117)
BILIRUBIN DIRECT: 0.1 mg/dL (ref 0.0–0.3)
BILIRUBIN TOTAL: 0.4 mg/dL (ref 0.2–1.2)
Total Protein: 7.8 g/dL (ref 6.0–8.3)

## 2014-07-07 LAB — IBC PANEL
IRON: 123 ug/dL (ref 42–145)
Saturation Ratios: 22.8 % (ref 20.0–50.0)
TRANSFERRIN: 385 mg/dL — AB (ref 212.0–360.0)

## 2014-07-07 LAB — T3, FREE: T3, Free: 3.3 pg/mL (ref 2.3–4.2)

## 2014-07-07 LAB — T4, FREE: Free T4: 0.88 ng/dL (ref 0.60–1.60)

## 2014-07-07 LAB — FERRITIN: FERRITIN: 24 ng/mL (ref 10.0–291.0)

## 2014-07-07 LAB — CORTISOL: Cortisol, Plasma: 8.1 ug/dL

## 2014-07-07 LAB — TSH: TSH: 2.05 u[IU]/mL (ref 0.35–4.50)

## 2014-07-08 LAB — METHYLMALONIC ACID, SERUM: Methylmalonic Acid, Quant: 53 nmol/L — ABNORMAL LOW (ref 87–318)

## 2014-07-21 ENCOUNTER — Telehealth: Payer: Self-pay | Admitting: Family Medicine

## 2014-07-21 ENCOUNTER — Encounter: Payer: Self-pay | Admitting: *Deleted

## 2014-07-21 NOTE — Telephone Encounter (Signed)
Boneta LucksJenny notified of lab results via phone.  Letter with results and instructions also mailed to patient.  See Result Note.

## 2014-07-21 NOTE — Telephone Encounter (Signed)
Pt returned your call re: lab results.   Pt c/b # [id number].

## 2014-07-31 ENCOUNTER — Other Ambulatory Visit: Payer: Self-pay | Admitting: Family Medicine

## 2014-07-31 MED ORDER — LORAZEPAM 1 MG PO TABS: 0.5000 mg | ORAL_TABLET | Freq: Three times a day (TID) | ORAL | Status: DC | PRN

## 2014-07-31 NOTE — Telephone Encounter (Signed)
Pt left v/m;[facility] and no meds were refilled in 06/2014. Pt request cb.

## 2014-07-31 NOTE — Addendum Note (Signed)
Addended by: Damita LackLORING, Romani Wilbon S on: 07/31/2014 05:08 PM   Modules accepted: Orders

## 2014-07-31 NOTE — Telephone Encounter (Addendum)
Looks like Dr. Patsy Lageropland refilled Lorazepam at office visit on 07/06/2014 but it was set on phone in and never got called to the pharmacy.  Refilled called in today to [facility] [address]  Victorino DikeJennifer notified.

## 2014-07-31 NOTE — Telephone Encounter (Signed)
Last office visit 07/06/2014.  Last refilled

## 2014-11-02 ENCOUNTER — Ambulatory Visit (INDEPENDENT_AMBULATORY_CARE_PROVIDER_SITE_OTHER): Payer: BC Managed Care – PPO | Admitting: Podiatry

## 2014-11-02 ENCOUNTER — Encounter: Payer: Self-pay | Admitting: Podiatry

## 2014-11-02 ENCOUNTER — Ambulatory Visit (INDEPENDENT_AMBULATORY_CARE_PROVIDER_SITE_OTHER): Payer: BC Managed Care – PPO

## 2014-11-02 VITALS — BP 109/76 | HR 81 | Resp 16 | Ht 68.0 in | Wt 150.0 lb

## 2014-11-02 DIAGNOSIS — S93601A Unspecified sprain of right foot, initial encounter: Secondary | ICD-10-CM | POA: Diagnosis not present

## 2014-11-02 DIAGNOSIS — M79671 Pain in right foot: Secondary | ICD-10-CM

## 2014-11-02 NOTE — Progress Notes (Signed)
She presents today after injuring her right foot approximately a month ago when stepping on hand sanitizer at school. She states that she stepped on it her foot twisted and slid. She states that initially she thought the foot was getting better and now she thinks that it seems to be getting worse. She is concerned [place]   Objective: vital signs are stable she is alert and oriented 3. Pulses are palpable. She has pain on palpation of the proximal first intermetatarsal space. Radiographs do not demonstrate any type of osseus abnormalities in this area. All other portions of the exam are normal.   Assessment: Lisfranc's sprain right foot.  Plan: encouraged compression elevation and ice. I offered an inflammatory but she declined I will follow-up with her as needed. Should this continue or worsen an MRI will be necessary.

## 2014-12-15 ENCOUNTER — Other Ambulatory Visit: Payer: Self-pay | Admitting: Family Medicine

## 2015-03-01 ENCOUNTER — Other Ambulatory Visit: Payer: Self-pay | Admitting: Family Medicine

## 2015-03-01 NOTE — Telephone Encounter (Signed)
Last office visit 07/06/2014.  Last refilled 07/31/2014 for #30 with 3 refills.  Ok to refill?

## 2015-03-02 NOTE — Telephone Encounter (Signed)
Lorazepam called into [facility] [address] 

## 2015-03-02 NOTE — Telephone Encounter (Signed)
Ok to refill #30, 3 refills

## 2015-05-06 ENCOUNTER — Other Ambulatory Visit: Payer: Self-pay | Admitting: Family Medicine

## 2015-08-09 ENCOUNTER — Ambulatory Visit (INDEPENDENT_AMBULATORY_CARE_PROVIDER_SITE_OTHER): Payer: BC Managed Care – PPO | Admitting: Family Medicine

## 2015-08-09 ENCOUNTER — Encounter: Payer: Self-pay | Admitting: Family Medicine

## 2015-08-09 VITALS — BP 100/80 | HR 71 | Temp 97.6°F | Ht 66.5 in | Wt 140.2 lb

## 2015-08-09 DIAGNOSIS — L658 Other specified nonscarring hair loss: Secondary | ICD-10-CM

## 2015-08-09 NOTE — Progress Notes (Signed)
Dr. Karleen Hampshire T. Jarad Barth, MD, CAQ Sports Medicine Primary Care and Sports Medicine [address] [place], [address] Phone: [phone] Fax: [phone]  08/09/2015  Patient: Beth Long, MRN: [id number], DOB: March 09, 1981, 35 y.o.  Primary Physician:  Hannah Beat, MD   Chief Complaint  Patient presents with  . Alopecia   Subjective:   Beth Long is a 35 y.o. very pleasant female patient who presents with the following:  Taking iron sulfate 325 mg, 2 tabs a day  Upped protein In October stopped sugar.  Eliminated refined sugar.  Taking biotin General vitamins  Wt Readings from Last 3 Encounters:  08/09/15 140 lb 4 oz (63.617 kg)  11/02/14 150 lb (68.04 kg)  07/06/14 162 lb (73.483 kg)     Past Medical History, Surgical History, Social History, Family History, Problem List, Medications, and Allergies have been reviewed and updated if relevant.  Patient Active Problem List   Diagnosis Date Noted  . COMMON MIGRAINE 08/08/2010  . DEPRESSIVE DISORDER 04/04/2010  . FATIGUE 12/21/2009  . GAD (generalized anxiety disorder) 12/29/2008  . NEPHROLITHIASIS, HX OF 12/29/2008    Past Medical History  Diagnosis Date  . Anemia   . Anxiety   . History of nephrolithiasis     Past Surgical History  Procedure Laterality Date  . Breast reduction surgery      Social History   Social History  . Marital Status: Married    Spouse Name: N/A  . Number of Children: N/A  . Years of Education: N/A   Occupational History  . school counsler [employer]   Social History Main Topics  . Smoking status: Former Games developer  . Smokeless tobacco: Never Used  . Alcohol Use: Yes  . Drug Use: No  . Sexual Activity: Not on file   Other Topics Concern  . Not on file   Social History Narrative   Regular exercise-yes    No family history on file.  Allergies  Allergen Reactions  . Amoxicillin     REACTION: rash from head to toe    Medication list  reviewed and updated in full in [facility].   GEN: No acute illnesses, no fevers, chills. GI: No n/v/d, eating normally Pulm: No SOB Interactive and getting along well at home.  Otherwise, ROS is as per the HPI.  Objective:   BP 100/80 mmHg  Pulse 71  Temp(Src) 97.6 F (36.4 C) (Oral)  Ht 5' 6.5" (1.689 m)  Wt 140 lb 4 oz (63.617 kg)  BMI 22.30 kg/m2  GEN: WDWN, NAD, Non-toxic, A & O x 3 HEENT: Atraumatic, Normocephalic. Neck supple. No masses, No LAD. Ears and Nose: No external deformity. CV: RRR, No M/G/R. No JVD. No thrill. No extra heart sounds. PULM: CTA B, no wheezes, crackles, rhonchi. No retractions. No resp. distress. No accessory muscle use. EXTR: No c/c/e NEURO Normal gait.  PSYCH: Normally interactive. Conversant. Not depressed or anxious appearing.  Calm demeanor.   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  She has been taking a number of vitamins, including iron and biotin without significant success.  She did have a low methylmalonic acid last year, and it seems she has not been  taking B12 supplementation.  She does have a strong family history of thyroid dysfunction, but testing is always been normal.  This is a challenging case, and recommended that she do a trial of some Rogaine for at least 2 or 3 months.  Hormonal testing would need for her to be off of birth control for at least 2 or 3 months.  Scalp biopsy could be attempted by dermatology if she wanted to help with more definitive diagnosis.  Follow-up: No Follow-up on file.  Orders Placed This Encounter  Procedures  . T4, free  . T3, free  . TSH  . Methylmalonic Acid, Serum    Signed,  Antiono Ettinger T. Jeremih Dearmas,  MD   Patient's Medications  New Prescriptions   No medications on file  Previous Medications   AVIANE 0.1-20 MG-MCG TABLET    Take 1 tablet by mouth daily.    LORAZEPAM (ATIVAN) 1 MG TABLET    TAKE ONE-HALF TABLET BY MOUTH EVERY EIGHT HOURS AS NEEDED   VALACYCLOVIR (VALTREX) 1000 MG TABLET    Take 2,000 mg by mouth as needed.   VIIBRYD 40 MG TABS    TAKE ONE TABLET BY MOUTH ONE TIME DAILY  Modified Medications   No medications on file  Discontinued Medications   DICLOFENAC (VOLTAREN) 75 MG EC TABLET    Take 1 tablet (75 mg total) by mouth 2 (two) times daily.   NABUMETONE (RELAFEN) 750 MG TABLET    Take 1 tablet (750 mg total) by mouth 2 (two) times daily.

## 2015-08-09 NOTE — Patient Instructions (Signed)
Rogaine or generic 5% topically twice a day

## 2015-08-09 NOTE — Progress Notes (Signed)
Pre visit review using our clinic review tool, if applicable. No additional management support is needed unless otherwise documented below in the visit note. 

## 2015-08-10 LAB — T4, FREE: Free T4: 0.97 ng/dL (ref 0.60–1.60)

## 2015-08-10 LAB — T3, FREE: T3 FREE: 2.9 pg/mL (ref 2.3–4.2)

## 2015-08-10 LAB — TSH: TSH: 1.36 u[IU]/mL (ref 0.35–4.50)

## 2015-08-12 ENCOUNTER — Telehealth: Payer: Self-pay | Admitting: Family Medicine

## 2015-08-12 LAB — METHYLMALONIC ACID, SERUM: METHYLMALONIC ACID, QUANT: 91 nmol/L (ref 87–318)

## 2015-08-12 NOTE — Telephone Encounter (Signed)
See lab note on 08/09/2015 labs.

## 2015-08-12 NOTE — Telephone Encounter (Signed)
Pt returned your call.  

## 2015-08-20 ENCOUNTER — Other Ambulatory Visit: Payer: Self-pay | Admitting: Family Medicine

## 2015-08-20 NOTE — Telephone Encounter (Signed)
Medication phoned to pharmacy.  

## 2015-08-20 NOTE — Telephone Encounter (Signed)
Pt left another voicemail at Triage, pt said she is out of med and requested this Rx on Wednesday, pt request call back today because she is completely out of med

## 2015-08-20 NOTE — Telephone Encounter (Signed)
Pt left v/m requesting status of lorazepam refill; last refilled # 30 x 3 on 03/02/15. Last seen acute visit 08/09/15.

## 2015-09-07 ENCOUNTER — Other Ambulatory Visit: Payer: Self-pay | Admitting: Family Medicine

## 2015-09-07 NOTE — Telephone Encounter (Signed)
Last office visit 08/09/2015 for Alopecia.  Last CPE 10/22/2013.  Ok to refill?

## 2015-09-26 ENCOUNTER — Other Ambulatory Visit: Payer: Self-pay | Admitting: Family Medicine

## 2015-09-26 NOTE — Telephone Encounter (Signed)
Last office visit 08/09/2015.  Last refilled 08/20/2015 for #30 with no refills.  Ok to refill?

## 2015-09-27 NOTE — Telephone Encounter (Signed)
Ok to refill 30, 2 refills 

## 2015-09-27 NOTE — Telephone Encounter (Signed)
Lorazepam called into [facility] [address] 

## 2015-10-29 ENCOUNTER — Other Ambulatory Visit: Payer: Self-pay

## 2015-10-29 NOTE — Telephone Encounter (Signed)
Pt left v/m [facility] [place]; pt has not gotten Valtrex since 2014. Pt request cb. Pt last annual exam on 10/22/13 and last acute visit on 08/09/15. No future appt scheduled.Please advise.

## 2015-10-30 MED ORDER — VALACYCLOVIR HCL 1 G PO TABS: 2000.0000 mg | ORAL_TABLET | Freq: Two times a day (BID) | ORAL | Status: DC

## 2015-11-16 ENCOUNTER — Telehealth: Payer: Self-pay

## 2015-11-16 MED ORDER — LORAZEPAM 1 MG PO TABS: ORAL_TABLET | ORAL | Status: DC

## 2015-11-16 NOTE — Telephone Encounter (Signed)
I think this is OK.  This is a one-time error.

## 2015-11-16 NOTE — Telephone Encounter (Signed)
[facility] notified okay to refill Lorazepam per Dr. Patsy Lageropland.  Hser notified.

## 2015-11-16 NOTE — Telephone Encounter (Signed)
Pt filled lorazepam on 11/12/15; pt threw away new [facility]ep[facility].  [facility] [place]; pt does have available refill that pt can get by paying out of pocket $ 9.11 if Dr Patsy Lageropland will OK pt getting another rx. Pt request cb and [facility] requires cb that Dr Patsy Lageropland is aware that lorazepam rx was accidentally discarded in trash and pt can pick up new rx.Please advise.

## 2015-12-28 ENCOUNTER — Other Ambulatory Visit: Payer: Self-pay | Admitting: Family Medicine

## 2015-12-28 NOTE — Telephone Encounter (Signed)
Last office visit 08/09/2015.  Last refilled 11/16/2015 for #30 with 0 refills.  Ok to refill?

## 2015-12-29 ENCOUNTER — Other Ambulatory Visit: Payer: Self-pay | Admitting: Family Medicine

## 2015-12-29 NOTE — Telephone Encounter (Signed)
Lorazepam called into [facility] [address]

## 2016-02-12 ENCOUNTER — Other Ambulatory Visit: Payer: Self-pay | Admitting: Family Medicine

## 2016-02-13 NOTE — Telephone Encounter (Signed)
Last office visit 08/27/15. Last refilled 12/28/15 for #30 with no refills.

## 2016-02-14 NOTE — Telephone Encounter (Signed)
Ok to ref #30, 2 ref 

## 2016-02-14 NOTE — Telephone Encounter (Signed)
Lorazepam called into [facility] [address] 

## 2016-03-07 ENCOUNTER — Telehealth: Payer: Self-pay | Admitting: *Deleted

## 2016-03-07 NOTE — Telephone Encounter (Signed)
Pt states her 89 yo son has very flat feet and she wanted to know if Dr. Al CorpusHyatt would be best to have pt evaluated or should she get him special shoes.  Left message informing pt it would be best to have pt evaluated by Dr. Al CorpusHyatt before he began to have problems with his feet.

## 2016-06-16 ENCOUNTER — Other Ambulatory Visit: Payer: Self-pay | Admitting: Family Medicine

## 2016-06-16 NOTE — Telephone Encounter (Signed)
[facility] [place], [place] - [address] Phone: [phone] 

## 2016-06-16 NOTE — Telephone Encounter (Signed)
Last office visit 08/09/2015.  Last refilled 02/14/2016 for #30 with 2 refills.  Ok to refill?

## 2016-06-16 NOTE — Telephone Encounter (Signed)
Ok to ref #30, 2 ref 

## 2016-08-14 ENCOUNTER — Encounter (INDEPENDENT_AMBULATORY_CARE_PROVIDER_SITE_OTHER): Payer: Self-pay

## 2016-08-14 ENCOUNTER — Encounter: Payer: Self-pay | Admitting: Family Medicine

## 2016-08-14 ENCOUNTER — Ambulatory Visit (INDEPENDENT_AMBULATORY_CARE_PROVIDER_SITE_OTHER): Payer: BC Managed Care – PPO | Admitting: Family Medicine

## 2016-08-14 VITALS — BP 96/66 | HR 96 | Temp 98.7°F | Ht 67.0 in | Wt 145.8 lb

## 2016-08-14 DIAGNOSIS — F411 Generalized anxiety disorder: Secondary | ICD-10-CM | POA: Diagnosis not present

## 2016-08-14 DIAGNOSIS — R41840 Attention and concentration deficit: Secondary | ICD-10-CM

## 2016-08-14 DIAGNOSIS — F325 Major depressive disorder, single episode, in full remission: Secondary | ICD-10-CM

## 2016-08-14 NOTE — Patient Instructions (Signed)

## 2016-08-14 NOTE — Progress Notes (Signed)
Pre visit review using our clinic review tool, if applicable. No additional management support is needed unless otherwise documented below in the visit note. 

## 2016-08-14 NOTE — Progress Notes (Signed)
Dr. Karleen Hampshire T. Lemoyne Nestor, MD, CAQ Sports Medicine Primary Care and Sports Medicine [address] [place], [address] Phone: [phone] Fax: [phone]  08/14/2016  Patient: Beth Long, MRN: [id number], DOB: [birth year], 36 y.o.  Primary Physician:  Hannah Beat, MD   Chief Complaint  Patient presents with  . Trouble Focusing   Subjective:   Beth Long is a 36 y.o. very pleasant female patient who presents with the following:  Focus issues and has had some more stressful episodes. Had some when she was younger and in school but did not take medication. Did not have a formal evaluation as a child.   Has been weighing on her.   Wakes up a lot at night.  Maybe mind racing.  No crying More responsibilites at work.  More expectations.  Really busy at work.   Took a mindfulness course.  No anhedonia.  Going to bed 9-5 No guilt.   No anxiety.   Past Medical History, Surgical History, Social History, Family History, Problem List, Medications, and Allergies have been reviewed and updated if relevant.  Patient Active Problem List   Diagnosis Date Noted  . COMMON MIGRAINE 08/08/2010  . DEPRESSIVE DISORDER 04/04/2010  . FATIGUE 12/21/2009  . GAD (generalized anxiety disorder) 12/29/2008  . NEPHROLITHIASIS, HX OF 12/29/2008    Past Medical History:  Diagnosis Date  . Anemia   . Anxiety   . History of nephrolithiasis     Past Surgical History:  Procedure Laterality Date  . BREAST REDUCTION SURGERY      Social History   Social History  . Marital status: Married    Spouse name: N/A  . Number of children: N/A  . 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 Smoking status: Former Games developer  . Smokeless tobacco: Never Used  . Alcohol use Yes  . Drug use: No  . Sexual activity: Not on file   Other Topics Concern  . Not on file   Social History Narrative   Regular  exercise-yes    No family history on file.  Allergies  Allergen Reactions  . Amoxicillin     REACTION: rash from head to toe    Medication list reviewed and updated in full in [facility].   GEN: No acute illnesses, no fevers, chills. GI: No n/v/d, eating normally Pulm: No SOB Interactive and getting along well at home.  Otherwise, ROS is as per the HPI.  Objective:   BP 96/66   Pulse 96   Temp 98.7 F (37.1 C) (Oral)   Ht 5\' 7"  (1.702 m)   Wt 145 lb 12 oz (66.1 kg)   BMI 22.83 kg/m   GEN: WDWN, NAD, Non-toxic, A & O x 3 HEENT: Atraumatic, Normocephalic. Neck supple. No masses, No LAD. Ears and Nose: No external deformity. EXTR: No c/c/e NEURO Normal gait.  PSYCH: Normally interactive. Conversant. Not depressed or anxious appearing.  Calm demeanor.   Laboratory and Imaging Data:  Assessment and Plan:   Inattention - Plan: Ambulatory referral to Psychology  GAD (generalized anxiety disorder)  Major depression in complete remission (HCC)  >25 minutes spent in face to face time with patient, >50% spent in counselling or coordination of care: Depression and Anxiety appear stable now. 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   Possible ADHD in a setting of known MDD and GAD.  Adult ADHD with 4 on part A and 7 on part B  Formal psychological evaluation re: ? ADHD  Follow-up: No Follow-up on file.   Orders Placed This Encounter  Procedures  . Ambulatory referral to Psychology    Signed,  Karleen HampshireSpencer T. Claxton Levitz, MD   Allergies as of 08/14/2016      Reactions   Amoxicillin    REACTION: rash from head to toe      Medication List       Accurate as of 08/14/16  2:10 PM. Always use your most recent med list.          AVIANE 0.1-20 MG-MCG tablet Generic drug:  levonorgestrel-ethinyl estradiol Take 1 tablet by mouth daily.   LORazepam 1 MG tablet Commonly known as:  ATIVAN TAKE 1/2 TABLET BY MOUTH  EVERY 8 HOURS AS NEEDED   valACYclovir 1000 MG tablet Commonly known as:  VALTREX Take 2 tablets (2,000 mg total) by mouth 2 (two) times daily. As directed   VIIBRYD 40 MG Tabs Generic drug:  Vilazodone HCl TAKE ONE TABLET BY MOUTH ONE TIME DAILY

## 2016-08-15 ENCOUNTER — Telehealth: Payer: Self-pay

## 2016-08-15 NOTE — Telephone Encounter (Signed)
Pt left v/m that pt was seen 08/14/16 with inattention concerns and pt could not get appt until 10/24/16. Pt wanted to know what could be done for inattention issue prior to 09/2016 appt. Pt request cb.

## 2016-08-16 NOTE — Telephone Encounter (Signed)
Per our discussion.

## 2016-08-17 NOTE — Telephone Encounter (Signed)
Called Colleen at [facility] she will call the patient to schedule.

## 2016-09-07 ENCOUNTER — Other Ambulatory Visit: Payer: Self-pay | Admitting: Family Medicine

## 2016-10-24 ENCOUNTER — Ambulatory Visit: Payer: BC Managed Care – PPO | Admitting: Psychology

## 2016-11-03 ENCOUNTER — Other Ambulatory Visit: Payer: Self-pay | Admitting: Family Medicine

## 2016-11-03 NOTE — Telephone Encounter (Signed)
Last office visit 08/14/2016.  Last refilled 06/16/2016 for #30 with 2 refills.  Ok to refill?

## 2016-11-06 ENCOUNTER — Ambulatory Visit (INDEPENDENT_AMBULATORY_CARE_PROVIDER_SITE_OTHER): Payer: BC Managed Care – PPO | Admitting: Family Medicine

## 2016-11-06 ENCOUNTER — Encounter: Payer: Self-pay | Admitting: Family Medicine

## 2016-11-06 VITALS — BP 117/89 | HR 82 | Temp 98.3°F | Ht 67.0 in | Wt 140.5 lb

## 2016-11-06 DIAGNOSIS — J029 Acute pharyngitis, unspecified: Secondary | ICD-10-CM

## 2016-11-06 DIAGNOSIS — R5381 Other malaise: Secondary | ICD-10-CM | POA: Diagnosis not present

## 2016-11-06 DIAGNOSIS — M791 Myalgia, unspecified site: Secondary | ICD-10-CM

## 2016-11-06 DIAGNOSIS — R5383 Other fatigue: Secondary | ICD-10-CM | POA: Diagnosis not present

## 2016-11-06 DIAGNOSIS — M2550 Pain in unspecified joint: Secondary | ICD-10-CM

## 2016-11-06 LAB — POCT RAPID STREP A (OFFICE): Rapid Strep A Screen: NEGATIVE

## 2016-11-06 MED ORDER — DOXYCYCLINE HYCLATE 100 MG PO TABS: 100.0000 mg | ORAL_TABLET | Freq: Two times a day (BID) | ORAL | 0 refills | Status: AC

## 2016-11-06 NOTE — Progress Notes (Signed)
Dr. Karleen HampshireSpencer T. Atlas Crossland, MD, CAQ Sports Medicine Primary Care and Sports Medicine [address] [place], [address] Phone: [phone] Fax: [phone]  11/06/2016  Patient: Beth Long, MRN: [id number], DOB: 09/21/1980, 36 y.o.  Primary Physician:  Hannah Beatopland, Mukund Weinreb, MD   Chief Complaint  Patient presents with  . Fatigue  . Sore Throat    on & off  . muscle aches   Subjective:   Beth Long is a 36 y.o. very pleasant female patient who presents with the following:  Patient presents with a number of complaints.  She thinks that her symptoms began about 2-1/2 months ago.  Approximately one or 2 days prior to this she did pull a tick off of her body.  Feels awful. Very tired and started the last time that she was here. Now at the point sleeps, lays down, goes down and gets dinner. Body aches, but it has been muscle tightness. 2 1/2 months - the throat has been off and on. It is been hurting more recently.  She has not had any nausea, vomiting, or diarrhea. Earache or URI type symptoms.  Mostly medium periods - recent had a heavy one in between. Also will get really hot.  No fever.   Has had some shortness of breath.   Past Medical History, Surgical History, Social History, Family History, Problem List, Medications, and Allergies have been reviewed and updated if relevant.  Patient Active Problem List   Diagnosis Date Noted  . COMMON MIGRAINE 08/08/2010  . Major depression in complete remission (HCC) 04/04/2010  . FATIGUE 12/21/2009  . GAD (generalized anxiety disorder) 12/29/2008  . NEPHROLITHIASIS, HX OF 12/29/2008    Past Medical History:  Diagnosis Date  . Anemia   . Anxiety   . History of nephrolithiasis     Past Surgical History:  Procedure Laterality Date  . BREAST REDUCTION SURGERY      Social History   Social History  . Marital status: Married    Spouse name: N/A  . Number of children: N/A  . 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 Smoking status: Former Games developermoker  . Smokeless tobacco: Never Used  . Alcohol use Yes  . Drug use: No  . Sexual activity: Not on file   Other Topics Concern  . Not on file   Social History Narrative   Regular exercise-yes    No family history on file.  Allergies  Allergen Reactions  . Amoxicillin     REACTION: rash from head to toe    Medication list reviewed and updated in full in [facility].  ROS: GEN: Acute illness details above GI: Tolerating PO intake GU: maintaining adequate hydration and urination Pulm: No SOB Interactive and getting along well at home.  Otherwise, ROS is as per the HPI.  Objective:   BP 117/89   Pulse 82   Temp 98.3 F (36.8 C) (Oral)   Ht 5\' 7"  (1.702 m)   Wt 140 lb 8 oz (63.7 kg)   BMI 22.01 kg/m   GEN: WDWN, NAD, Non-toxic, A & O x 3 HEENT: Atraumatic, Normocephalic. Neck supple. No masses, No LAD. Ears and Nose: No external deformity. CV: RRR, No M/G/R. No JVD. No thrill. No extra heart sounds. PULM: CTA B, no wheezes, crackles, rhonchi. No retractions. No resp. distress. No accessory muscle use. ABD: S, NT, ND, +BS. No rebound. No HSM.  EXTR: No c/c/e NEURO Normal gait.  PSYCH: Normally interactive. Conversant. Not depressed or anxious appearing.  Calm demeanor.     Laboratory and Imaging Data: Results for orders placed or performed in visit on 11/06/16  POCT rapid strep A  Result Value Ref Range   Rapid Strep A Screen Negative Negative     Assessment and Plan:   Sore throat - Plan: POCT rapid strep A, Basic metabolic panel, CBC with Differential/Platelet, Hepatic function panel, TSH, Epstein-Barr virus VCA antibody panel, Rocky mtn spotted fvr abs pnl(IgG+IgM), Lyme Ab/Western Blot Reflex, Vitamin B12, Ehrlichia antibody panel  Malaise and fatigue - Plan: Basic metabolic panel, CBC with Differential/Platelet, Hepatic  function panel, TSH, Epstein-Barr virus VCA antibody panel, Rocky mtn spotted fvr abs pnl(IgG+IgM), Lyme Ab/Western Blot Reflex, Vitamin B12, Ehrlichia antibody panel  Myalgia - Plan: Basic metabolic panel, CBC with Differential/Platelet, Hepatic function panel, TSH, Epstein-Barr virus VCA antibody panel, Rocky mtn spotted fvr abs pnl(IgG+IgM), Lyme Ab/Western Blot Reflex, Vitamin B12, Ehrlichia antibody panel  Arthralgia, unspecified joint - Plan: Basic metabolic panel, CBC with Differential/Platelet, Hepatic function panel, TSH, Epstein-Barr virus VCA antibody panel, Rocky mtn spotted fvr abs pnl(IgG+IgM), Lyme Ab/Western Blot Reflex, Vitamin B12, Ehrlichia antibody panel  Multiple systems involved without a clear source.  Think is reasonable to check for basic laboratories as above and include tickborne illness labs as well as mono labs.  Given exposures, I will go ahead and start her on doxycycline.  Follow-up: No Follow-up on file.  No future appointments.  Meds ordered this encounter  Medications  . EVEKEO 10 MG TABS    Sig: Take 1 tablet by mouth every morning.   Marland Kitchen doxycycline (VIBRA-TABS) 100 MG tablet    Sig: Take 1 tablet (100 mg total) by mouth 2 (two) times daily.    Dispense:  20 tablet    Refill:  0   There are no discontinued medications. Orders Placed This Encounter  Procedures  . Basic metabolic panel  . CBC with Differential/Platelet  . Hepatic function panel  . TSH  . Epstein-Barr virus VCA antibody panel  . Rocky mtn spotted fvr abs pnl(IgG+IgM)  . Lyme Ab/Western Blot Reflex  . Vitamin B12  . Ehrlichia antibody panel  . POCT rapid strep A    Signed,  Brittiney Dicostanzo T. Orel Cooler, MD   Allergies as of 11/06/2016      Reactions   Amoxicillin    REACTION: rash from head to toe      Medication List       Accurate as of 11/06/16 11:59 PM. Always use your most recent med list.          AVIANE 0.1-20 MG-MCG tablet Generic drug:  levonorgestrel-ethinyl  estradiol Take 1 tablet by mouth daily.   doxycycline 100 MG tablet Commonly known as:  VIBRA-TABS Take 1 tablet (100 mg total) by mouth 2 (two) times daily.   EVEKEO 10 MG Tabs Generic drug:  Amphetamine Sulfate Take 1 tablet by mouth every morning.   LORazepam 1 MG tablet Commonly known as:  ATIVAN TAKE 1/2 TABLET BY MOUTH EVERY 8 HOURS AS NEEDED   valACYclovir 1000 MG tablet Commonly known as:  VALTREX Take 2 tablets (2,000 mg total) by mouth 2 (two) times daily. As directed   VIIBRYD 40 MG Tabs Generic drug:  Vilazodone HCl TAKE ONE TABLET BY MOUTH ONE TIME DAILY

## 2016-11-06 NOTE — Telephone Encounter (Signed)
[facility] [place] - [address] Phone: [phone]

## 2016-11-06 NOTE — Telephone Encounter (Signed)
Ok to refill 30, 2 ref 

## 2016-11-07 LAB — VITAMIN B12

## 2016-11-07 LAB — HEPATIC FUNCTION PANEL
ALBUMIN: 4.4 g/dL (ref 3.5–5.2)
ALT: 15 U/L (ref 0–35)
AST: 18 U/L (ref 0–37)
Alkaline Phosphatase: 46 U/L (ref 39–117)
Bilirubin, Direct: 0 mg/dL (ref 0.0–0.3)
TOTAL PROTEIN: 7.7 g/dL (ref 6.0–8.3)
Total Bilirubin: 0.3 mg/dL (ref 0.2–1.2)

## 2016-11-07 LAB — CBC WITH DIFFERENTIAL/PLATELET
BASOS PCT: 0.9 % (ref 0.0–3.0)
Basophils Absolute: 0.1 10*3/uL (ref 0.0–0.1)
EOS PCT: 1.7 % (ref 0.0–5.0)
Eosinophils Absolute: 0.1 10*3/uL (ref 0.0–0.7)
HEMATOCRIT: 41.3 % (ref 36.0–46.0)
HEMOGLOBIN: 14.2 g/dL (ref 12.0–15.0)
LYMPHS PCT: 32 % (ref 12.0–46.0)
Lymphs Abs: 2.3 10*3/uL (ref 0.7–4.0)
MCHC: 34.4 g/dL (ref 30.0–36.0)
MCV: 92.5 fl (ref 78.0–100.0)
Monocytes Absolute: 0.4 10*3/uL (ref 0.1–1.0)
Monocytes Relative: 5.1 % (ref 3.0–12.0)
Neutro Abs: 4.3 10*3/uL (ref 1.4–7.7)
Neutrophils Relative %: 60.3 % (ref 43.0–77.0)
Platelets: 323 10*3/uL (ref 150.0–400.0)
RBC: 4.47 Mil/uL (ref 3.87–5.11)
RDW: 13.8 % (ref 11.5–15.5)
WBC: 7.1 10*3/uL (ref 4.0–10.5)

## 2016-11-07 LAB — EPSTEIN-BARR VIRUS VCA ANTIBODY PANEL
EBV NA IgG: >600 U/mL — ABNORMAL HIGH
EBV VCA IgG: 402 U/mL — ABNORMAL HIGH
EBV VCA IgM: <36 U/mL

## 2016-11-07 LAB — ROCKY MTN SPOTTED FVR ABS PNL(IGG+IGM)
RMSF IGG: NOT DETECTED
RMSF IGM: NOT DETECTED

## 2016-11-07 LAB — LYME AB/WESTERN BLOT REFLEX

## 2016-11-07 LAB — BASIC METABOLIC PANEL
BUN: 17 mg/dL (ref 6–23)
CHLORIDE: 102 meq/L (ref 96–112)
CO2: 28 mEq/L (ref 19–32)
Calcium: 10 mg/dL (ref 8.4–10.5)
Creatinine, Ser: 0.73 mg/dL (ref 0.40–1.20)
GFR: 94.65 mL/min (ref >60.00)
Glucose, Bld: 77 mg/dL (ref 70–99)
POTASSIUM: 3.8 meq/L (ref 3.5–5.1)
SODIUM: 138 meq/L (ref 135–145)

## 2016-11-07 LAB — TSH: TSH: 2.94 u[IU]/mL (ref 0.35–4.50)

## 2016-11-08 LAB — EHRLICHIA ANTIBODY PANEL

## 2016-11-10 ENCOUNTER — Telehealth: Payer: Self-pay | Admitting: Family Medicine

## 2016-11-10 NOTE — Telephone Encounter (Signed)
Pt called to get lab results from 6/11. She is requesting a cb when avail.

## 2016-12-18 ENCOUNTER — Other Ambulatory Visit: Payer: Self-pay | Admitting: Family Medicine

## 2016-12-18 NOTE — Telephone Encounter (Signed)
Last office visit 11/06/2016.  Last refilled 11/06/2016 for #30 with 2 refills.  See note from pharmacy on refill.

## 2016-12-26 NOTE — Telephone Encounter (Signed)
It looks like through the controlled database that she got #30 on 7/23.  It also looks as if a refill disappeared, which I have never seen before.   I don't completely understand the question, but #2 refills seems to be appropriate to add in this case.

## 2016-12-27 NOTE — Telephone Encounter (Signed)
[facility]  They did fill the lorazepam for #30 on 12/18/2016 but for some reason the system is not showing the additional refill that was originally called in on 11/06/2016. Rx was called in on 11/06/2016 for #30 with 2 refills. I gave verbal okay to add back the one refill, that is missing, to be filled after 12/18/2016.

## 2017-01-10 ENCOUNTER — Other Ambulatory Visit: Payer: Self-pay | Admitting: Family Medicine

## 2017-01-17 ENCOUNTER — Ambulatory Visit (INDEPENDENT_AMBULATORY_CARE_PROVIDER_SITE_OTHER): Payer: BC Managed Care – PPO | Admitting: Family Medicine

## 2017-01-17 ENCOUNTER — Encounter: Payer: Self-pay | Admitting: Family Medicine

## 2017-01-17 VITALS — BP 90/72 | HR 94 | Temp 98.6°F | Ht 67.0 in | Wt 139.2 lb

## 2017-01-17 DIAGNOSIS — Z111 Encounter for screening for respiratory tuberculosis: Secondary | ICD-10-CM | POA: Diagnosis not present

## 2017-01-17 DIAGNOSIS — H6993 Unspecified Eustachian tube disorder, bilateral: Secondary | ICD-10-CM

## 2017-01-17 DIAGNOSIS — H6983 Other specified disorders of Eustachian tube, bilateral: Secondary | ICD-10-CM | POA: Diagnosis not present

## 2017-01-17 DIAGNOSIS — Z23 Encounter for immunization: Secondary | ICD-10-CM

## 2017-01-17 MED ORDER — FLUTICASONE PROPIONATE 50 MCG/ACT NA SUSP: 2.0000 | Freq: Every day | NASAL | 2 refills | Status: DC

## 2017-01-17 NOTE — Progress Notes (Signed)
Dr. Karleen Hampshire T. Ariyanna Oien, MD, CAQ Sports Medicine Primary Care and Sports Medicine [address] [place], [address] Phone: [phone] Fax: [phone]  01/17/2017  Patient: Beth Long, MRN: [id number], DOB: 1980-07-21, 36 y.o.  Primary Physician:  Hannah Beat, MD   Chief Complaint  Patient presents with  . Form Complettion  . Check Ears   Subjective:   Beth Long is a 36 y.o. very pleasant female patient who presents with the following:  Earache. She is been having some fullness in both of her years for months now.  Occasionally she will have a dull ache in her upper jaw additionally and around the year.  She does have some intermittent allergies.  She has tried some antihistamines which may be of help slightly.   Past Medical History, Surgical History, Social History, Family History, Problem List, Medications, and Allergies have been reviewed and updated if relevant.  Patient Active Problem List   Diagnosis Date Noted  . COMMON MIGRAINE 08/08/2010  . Major depression in complete remission (HCC) 04/04/2010  . FATIGUE 12/21/2009  . GAD (generalized anxiety disorder) 12/29/2008  . NEPHROLITHIASIS, HX OF 12/29/2008    Past Medical History:  Diagnosis Date  . Anemia   . Anxiety   . History of nephrolithiasis     Past Surgical History:  Procedure Laterality Date  . BREAST REDUCTION SURGERY      Social History   Social History  . Marital status: Married    Spouse name: N/A  . Number of children: N/A  . 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 Smoking status: Former Games developer  . Smokeless tobacco: Never Used  . Alcohol use Yes  . Drug use: No  . Sexual activity: Not on file   Other Topics Concern  . Not on file   Social History Narrative   Regular exercise-yes    No family history on file.  Allergies  Allergen Reactions  . Amoxicillin    REACTION: rash from head to toe    Medication list reviewed and updated in full in [facility].   GEN: No acute illnesses, no fevers, chills. GI: No n/v/d, eating normally Pulm: No SOB Interactive and getting along well at home.  Otherwise, ROS is as per the HPI.  Objective:   BP 90/72   Pulse 94   Temp 98.6 F (37 C) (Oral)   Ht 5\' 7"  (1.702 m)   Wt 139 lb 4 oz (63.2 kg)   BMI 21.81 kg/m   GEN: WDWN, NAD, Non-toxic, A & O x 3 HEENT: Atraumatic, Normocephalic. Neck supple. No masses, No LAD. Ears and Nose: No external deformity. Serous fluid B CV: RRR, No M/G/R. No JVD. No thrill. No extra heart sounds. PULM: CTA B, no wheezes, crackles, rhonchi. No retractions. No resp. distress. No accessory muscle use. EXTR: No c/c/e NEURO Normal gait.  PSYCH: Normally interactive. Conversant. Not depressed or anxious appearing.  Calm demeanor.   Laboratory and Imaging Data:  Assessment and Plan:   ETD (Eustachian tube dysfunction), bilateral  Screening for tuberculosis - Plan: TB Skin Test  Need for prophylactic vaccination with combined diphtheria-tetanus-pertussis (DTP) vaccine - Plan: Tdap vaccine greater than or equal to 7yo IM  Form completed, classic ETD  Eustachian Tube Dysfunction: There is a tube that connects between the sinuses and behind the ear called the "eustachian tube." Sometimes when you have allergies, a cold,  or nasal congestion for any reason this tube can get blocked and pressure cannot equalize in your ears. (Like if you swim in deep water) This can also trap fluid behind the ear and give you a full, pressure-like sensation that is uncomfortable, but it is not an ear infection.  Recommendations: Afrin Nasal Spray: 2 sprays twice a day for a maximum of 3-4 days (longer than this and your nose gets addicted, and you have rebound swelling that makes it worse.) Sudafed: Either pseudoephedrine or phenylephrine. As directed on box. (Not is heart problems or  high blood pressure) Anti-Histamine: Allegra, Zyrtec, or Claritin. All over the counter now and once a day.  Nasal steroid. Nasacort is over the counter now. About 10 prescription ones exist.  If you develop fever > 100.4, then things can change fluid behind the ear does increase your risk of developing an ear infection.   Follow-up: No Follow-up on file.  Meds ordered this encounter  Medications  . fluticasone (FLONASE) 50 MCG/ACT nasal spray    Sig: Place 2 sprays into both nostrils daily.    Dispense:  16 g    Refill:  2   There are no discontinued medications. Orders Placed This Encounter  Procedures  . Tdap vaccine greater than or equal to 7yo IM  . TB Skin Test    Signed,  Karleen Hampshire T. Quintina Hakeem, MD   Patient's Medications  New Prescriptions   FLUTICASONE (FLONASE) 50 MCG/ACT NASAL SPRAY    Place 2 sprays into both nostrils daily.  Previous Medications   AVIANE 0.1-20 MG-MCG TABLET    Take 1 tablet by mouth daily.    EVEKEO 10 MG TABS    Take 1 tablet by mouth every morning.    LORAZEPAM (ATIVAN) 1 MG TABLET    TAKE 1/2 TABLET BY MOUTH EVERY 8 HOURS AS NEEDED   VALACYCLOVIR (VALTREX) 1000 MG TABLET    TAKE 2 TABLETS (2,000 MG TOTAL) BY MOUTH 2 (TWO) TIMES DAILY AS DIRECTED.   VIIBRYD 40 MG TABS    TAKE ONE TABLET BY MOUTH ONE TIME DAILY  Modified Medications   No medications on file  Discontinued Medications   No medications on file

## 2017-01-17 NOTE — Patient Instructions (Signed)
Eustachian Tube Dysfunction: There is a tube that connects between the sinuses and behind the ear called the "eustachian tube." Sometimes when you have allergies, a cold, or nasal congestion for any reason this tube can get blocked and pressure cannot equalize in your ears. (Like if you swim in deep water) This can also trap fluid behind the ear and give you a full, pressure-like sensation that is uncomfortable, but it is not an ear infection.  Recommendations: Afrin Nasal Spray: 2 sprays twice a day for a maximum of 3-4 days (longer than this and your nose gets addicted, and you have rebound swelling that makes it worse.) Sudafed: Either pseudoephedrine or phenylephrine. As directed on box. (Not is heart problems or high blood pressure) Anti-Histamine: Allegra, Zyrtec, or Claritin. All over the counter now and once a day.  Nasal steroid. Nasacort is over the counter now. About 10 prescription ones exist.  If you develop fever > 100.4, then things can change fluid behind the ear does increase your risk of developing an ear infection.  

## 2017-01-19 LAB — TB SKIN TEST
Induration: 0 mm
TB Skin Test: NEGATIVE

## 2017-03-16 ENCOUNTER — Other Ambulatory Visit: Payer: Self-pay

## 2017-03-16 NOTE — Telephone Encounter (Signed)
Pt left v/m requesting refill [facility]. Last refilled # 30 on 12/27/16 last seen 08/14/16.

## 2017-03-16 NOTE — Telephone Encounter (Signed)
Pt called back checking on rx Best number [phone]

## 2017-03-19 MED ORDER — LORAZEPAM 1 MG PO TABS: ORAL_TABLET | ORAL | 2 refills | Status: DC

## 2017-03-19 NOTE — Telephone Encounter (Signed)
Ok to ref #30, 2 ref 

## 2017-03-19 NOTE — Telephone Encounter (Signed)
L[facility]azepam called into [facility] [place] - [id number] Phone: [phone].  Anyia notified.

## 2017-05-01 ENCOUNTER — Other Ambulatory Visit: Payer: Self-pay | Admitting: Family Medicine

## 2017-05-01 NOTE — Telephone Encounter (Signed)
Ok to refill 30, 3 ref 

## 2017-05-01 NOTE — Telephone Encounter (Signed)
Last office visit 01/17/2017.  Last refilled 03/19/2017 for #30 with 2 refills.  Ok to refill?

## 2017-05-01 NOTE — Telephone Encounter (Signed)
[facility] [address]

## 2017-05-11 ENCOUNTER — Telehealth: Payer: Self-pay | Admitting: Family Medicine

## 2017-05-11 NOTE — Telephone Encounter (Signed)
Pt last seen 01/17/17; do not see where pt has had annual with Dr Patsy Lageropland and do not see where Dr Patsy Lageropland has prescribed Vyvanse.Please advise.

## 2017-05-11 NOTE — Telephone Encounter (Signed)
I would need to review Dr. Lenoria FarrierAkers records first. Can we get all records for Dr. Satira AnisL Akers at the listed number?

## 2017-05-11 NOTE — Telephone Encounter (Signed)
Sent to provider 

## 2017-05-11 NOTE — Telephone Encounter (Signed)
Copied from CRM #[id number]. Topic: Inquiry >> May 11, 2017  1:30 PM Raquel SarnaHayes, Teresa G wrote: Pt's doctor Dr. Satira AnisL. Akers is moving to [place] - [phone] (old office number) wanting to transfer her records and to get the Vyvanse 60mg  the dr prescribed to her can be prescribed by Dr. Patsy Lageropland.

## 2017-05-12 LAB — HM PAP SMEAR: HM Pap smear: NEGATIVE

## 2017-05-14 NOTE — Telephone Encounter (Signed)
Please help: per my prior message

## 2017-05-14 NOTE — Telephone Encounter (Signed)
Spoke to patient by telephone and was advised that she will have the records faxed over from Dr. Lenoria FarrierAkers for Dr. Cyndie Chimeopland's review.

## 2017-05-17 ENCOUNTER — Other Ambulatory Visit: Payer: Self-pay | Admitting: Family Medicine

## 2017-05-17 MED ORDER — LISDEXAMFETAMINE DIMESYLATE 60 MG PO CAPS: 60.0000 mg | ORAL_CAPSULE | ORAL | 0 refills | Status: DC

## 2017-05-17 NOTE — Progress Notes (Signed)
Psych records reviewed. I am ok taking over their ADHD med scripts.   Can you let her know ready?

## 2017-05-17 NOTE — Progress Notes (Signed)
Beth Long notified that her prescriptions is ready to be picked up at the front desk.

## 2017-06-26 ENCOUNTER — Other Ambulatory Visit: Payer: Self-pay | Admitting: Family Medicine

## 2017-06-26 ENCOUNTER — Telehealth: Payer: Self-pay | Admitting: Family Medicine

## 2017-06-26 MED ORDER — LISDEXAMFETAMINE DIMESYLATE 60 MG PO CAPS: 60.0000 mg | ORAL_CAPSULE | ORAL | 0 refills | Status: DC

## 2017-06-26 NOTE — Progress Notes (Signed)
done

## 2017-06-26 NOTE — Telephone Encounter (Signed)
Copied from CRM [id number]. Topic: Quick Communication - See Telephone Encounter >> Jun 26, 2017 11:07 AM Windy KalataMichael, Monaca Wadas L, NT wrote: CRM for notification. See Telephone encounter for:  06/26/17.  Pt is needing a refill on Vyvanse 60mg  [facility] Please advise

## 2017-06-26 NOTE — Telephone Encounter (Signed)
Last office visit 01/17/17 Last refill 05/17/17 #30

## 2017-06-26 NOTE — Telephone Encounter (Signed)
done

## 2017-07-27 ENCOUNTER — Other Ambulatory Visit: Payer: Self-pay | Admitting: Family Medicine

## 2017-07-27 NOTE — Telephone Encounter (Signed)
Copied from CRM [id number]. Topic: Quick Communication - Rx Refill/Question >> Jul 27, 2017 11:22 AM Louie BunPalacios Medina, Rosey Batheresa D wrote: Medication: lisdexamfetamine (VYVANSE) 60 MG capsule   Has the patient contacted their pharmacy? Yes   (Agent: If no, request that the patient contact the pharmacy for the refill.)   Preferred Pharmacy (with phone number or street name): [id number] [place] , [place] - [address]   Agent: Please be advised that RX refills may take up to 3 business days. We ask that you follow-up with your pharmacy.

## 2017-07-27 NOTE — Telephone Encounter (Signed)
Vyvanse refill LOV: 08/14/16 PCP: Dr Hannah BeatSpencer Copland [facility] [address]

## 2017-07-27 NOTE — Telephone Encounter (Signed)
Pt calling to f/u on her med refill on Vyvanse she states that she will run out on Sunday

## 2017-07-29 MED ORDER — LISDEXAMFETAMINE DIMESYLATE 60 MG PO CAPS: 60.0000 mg | ORAL_CAPSULE | ORAL | 0 refills | Status: DC

## 2017-07-30 NOTE — Telephone Encounter (Signed)
Beth Long notified that her Vyvanse refill was sent in to her pharmacy Sunday 07/29/2017.

## 2017-08-07 ENCOUNTER — Other Ambulatory Visit: Payer: Self-pay | Admitting: Family Medicine

## 2017-08-07 NOTE — Telephone Encounter (Signed)
Last office visit 01/17/2017.  Last refilled 09/07/2016 for #90 with 1 refill.  Ok to refill?

## 2017-08-28 ENCOUNTER — Other Ambulatory Visit: Payer: Self-pay | Admitting: Family Medicine

## 2017-08-28 NOTE — Telephone Encounter (Signed)
Copied from CRM [id number]. Topic: Quick Communication - Rx Refill/Question >> Aug 28, 2017 11:28 AM Everardo PacificMoton, Beth Long, NT wrote: Medication: Vyvanse 60 mg Has the patient contacted their pharmacy? Yes Patient calling because she needs a refill on her above medication. Stated that she has been in contact with her pharmacy about this medication as well Preferred Pharmacy (with phone number or street name): [facility] [address] [place] [phone] Agent: Please be advised that RX refills may take up to 3 business days. We ask that you follow-up with your pharmacy.

## 2017-08-28 NOTE — Telephone Encounter (Signed)
Req. For Vyvanse refill Last refill 07/29/17; #30; RF 0 Last office visit 01/17/17 -  acute                           08/14/16 - inattentiveness PCP:  Dr. Patsy Lageropland [facility] [address] [place]

## 2017-08-29 MED ORDER — LISDEXAMFETAMINE DIMESYLATE 60 MG PO CAPS: 60.0000 mg | ORAL_CAPSULE | ORAL | 0 refills | Status: DC

## 2017-09-26 ENCOUNTER — Other Ambulatory Visit: Payer: Self-pay | Admitting: Family Medicine

## 2017-09-26 MED ORDER — LISDEXAMFETAMINE DIMESYLATE 60 MG PO CAPS: 60.0000 mg | ORAL_CAPSULE | ORAL | 0 refills | Status: DC

## 2017-09-26 NOTE — Telephone Encounter (Signed)
[facility] Last refilled # 30 on 08/29/17 Last seen 08/14/16 for inattention Has CPX scheduled 11/14/17.

## 2017-09-26 NOTE — Telephone Encounter (Signed)
LOV  08/14/16 Dr. Patsy Lager Last refill 08/29/17  # 30  0 refills

## 2017-09-26 NOTE — Telephone Encounter (Signed)
Copied from CRM [id number]. Topic: Quick Communication - Rx Refill/Question >> Sep 26, 2017 11:41 AM Herby Abraham C wrote: Medication: Vyvanse 60 mg  Has the patient contacted their pharmacy? No  (Agent: If no, request that the patient contact the pharmacy for the refill.)  Preferred Pharmacy (with phone number or street name): [id number] [place] - [address] [phone] (Phone) [phone] (Fax)     Agent: Please be advised that RX refills may take up to 3 business days. We ask that you follow-up with your pharmacy.

## 2017-10-15 ENCOUNTER — Other Ambulatory Visit: Payer: Self-pay | Admitting: Family Medicine

## 2017-10-16 NOTE — Telephone Encounter (Signed)
Last office visit 01/17/2017.  Last refilled 05/21/2017 for #30 with 3 refills.  Ok to refill?

## 2017-10-25 ENCOUNTER — Other Ambulatory Visit: Payer: Self-pay | Admitting: Family Medicine

## 2017-10-25 NOTE — Telephone Encounter (Signed)
Copied from CRM [id number]. Topic: Quick Communication - Rx Refill/Question >> Oct 25, 2017 10:21 AM Percival Spanish wrote: Medication  lisdexamfetamine (VYVANSE) 60 MG capsule  Has the patient contacted their pharmacy yes    [facility]  [facility] [address]   Agent: Please be advised that RX refills may take up to 3 business days. We ask that you follow-up with your pharmacy.

## 2017-10-25 NOTE — Telephone Encounter (Signed)
Vyvanse refill Last Refill:09/26/17 # 30 Last OV: 01/17/17 PCP: Dr. Patsy Lager [facility]acy:[facility] [address]

## 2017-10-26 ENCOUNTER — Telehealth: Payer: Self-pay | Admitting: Family Medicine

## 2017-10-26 MED ORDER — LISDEXAMFETAMINE DIMESYLATE 60 MG PO CAPS: 60.0000 mg | ORAL_CAPSULE | ORAL | 0 refills | Status: DC

## 2017-10-26 NOTE — Telephone Encounter (Signed)
Pt called to see what can be done to get her Rx filled, call pt to advise

## 2017-10-26 NOTE — Telephone Encounter (Addendum)
Refill is waiting to be approved by Dr. Patsy Lageropland.   The request sent by patient through MyChart was a duplicate request.

## 2017-10-26 NOTE — Telephone Encounter (Signed)
Marcelle notified that Dr. Patsy Lager will refill her medication later this afternoon.

## 2017-10-26 NOTE — Telephone Encounter (Signed)
Duplicate Request.

## 2017-11-05 ENCOUNTER — Other Ambulatory Visit: Payer: Self-pay | Admitting: Family Medicine

## 2017-11-05 DIAGNOSIS — E538 Deficiency of other specified B group vitamins: Secondary | ICD-10-CM

## 2017-11-05 DIAGNOSIS — Z0000 Encounter for general adult medical examination without abnormal findings: Secondary | ICD-10-CM

## 2017-11-08 ENCOUNTER — Other Ambulatory Visit: Payer: BC Managed Care – PPO

## 2017-11-09 ENCOUNTER — Other Ambulatory Visit (INDEPENDENT_AMBULATORY_CARE_PROVIDER_SITE_OTHER): Payer: BC Managed Care – PPO

## 2017-11-09 DIAGNOSIS — E538 Deficiency of other specified B group vitamins: Secondary | ICD-10-CM

## 2017-11-09 DIAGNOSIS — R5383 Other fatigue: Secondary | ICD-10-CM

## 2017-11-09 DIAGNOSIS — Z0000 Encounter for general adult medical examination without abnormal findings: Secondary | ICD-10-CM

## 2017-11-09 LAB — HEPATIC FUNCTION PANEL
ALT: 18 U/L (ref 0–35)
AST: 18 U/L (ref 0–37)
Albumin: 3.9 g/dL (ref 3.5–5.2)
Alkaline Phosphatase: 32 U/L — ABNORMAL LOW (ref 39–117)
BILIRUBIN DIRECT: 0.1 mg/dL (ref 0.0–0.3)
BILIRUBIN TOTAL: 0.4 mg/dL (ref 0.2–1.2)
Total Protein: 7.1 g/dL (ref 6.0–8.3)

## 2017-11-09 LAB — CBC WITH DIFFERENTIAL/PLATELET
BASOS ABS: 0 10*3/uL (ref 0.0–0.1)
BASOS PCT: 0.6 % (ref 0.0–3.0)
EOS PCT: 2.4 % (ref 0.0–5.0)
Eosinophils Absolute: 0.1 10*3/uL (ref 0.0–0.7)
HCT: 40.6 % (ref 36.0–46.0)
Hemoglobin: 14 g/dL (ref 12.0–15.0)
LYMPHS ABS: 2 10*3/uL (ref 0.7–4.0)
Lymphocytes Relative: 34.6 % (ref 12.0–46.0)
MCHC: 34.5 g/dL (ref 30.0–36.0)
MCV: 93.1 fl (ref 78.0–100.0)
MONOS PCT: 5.8 % (ref 3.0–12.0)
Monocytes Absolute: 0.3 10*3/uL (ref 0.1–1.0)
NEUTROS ABS: 3.2 10*3/uL (ref 1.4–7.7)
Neutrophils Relative %: 56.6 % (ref 43.0–77.0)
PLATELETS: 321 10*3/uL (ref 150.0–400.0)
RBC: 4.36 Mil/uL (ref 3.87–5.11)
RDW: 14.3 % (ref 11.5–15.5)
WBC: 5.7 10*3/uL (ref 4.0–10.5)

## 2017-11-09 LAB — BASIC METABOLIC PANEL
BUN: 16 mg/dL (ref 6–23)
CALCIUM: 9.1 mg/dL (ref 8.4–10.5)
CO2: 24 mEq/L (ref 19–32)
CREATININE: 0.72 mg/dL (ref 0.40–1.20)
Chloride: 105 mEq/L (ref 96–112)
GFR: 95.66 mL/min (ref >60.00)
Glucose, Bld: 81 mg/dL (ref 70–99)
Potassium: 4.5 mEq/L (ref 3.5–5.1)
Sodium: 138 mEq/L (ref 135–145)

## 2017-11-09 LAB — LIPID PANEL
CHOL/HDL RATIO: 3
CHOLESTEROL: 197 mg/dL (ref 0–200)
HDL: 60.3 mg/dL (ref >39.00)
LDL CALC: 117 mg/dL — AB (ref 0–99)
NonHDL: 136.59
TRIGLYCERIDES: 98 mg/dL (ref 0.0–149.0)
VLDL: 19.6 mg/dL (ref 0.0–40.0)

## 2017-11-09 LAB — TSH: TSH: 1.09 u[IU]/mL (ref 0.35–4.50)

## 2017-11-09 LAB — VITAMIN D 25 HYDROXY (VIT D DEFICIENCY, FRACTURES): VITD: 60.6 ng/mL (ref 30.00–100.00)

## 2017-11-09 LAB — VITAMIN B12: VITAMIN B 12: 1402 pg/mL — AB (ref 211–911)

## 2017-11-09 NOTE — Addendum Note (Signed)
Addended by: Alvina ChouWALSH, Renelda Kilian J on: 11/09/2017 08:26 AM   Modules accepted: Orders

## 2017-11-12 LAB — METHYLMALONIC ACID, SERUM: METHYLMALONIC ACID, QUANT: 62 nmol/L — AB (ref 87–318)

## 2017-11-13 ENCOUNTER — Encounter: Payer: Self-pay | Admitting: Family Medicine

## 2017-11-13 NOTE — Progress Notes (Signed)
Dr. Frederico Hamman T. Parlee Amescua, MD, [facility] Medicine [place], [address] Phone: [phone] Fax: [phone]  11/14/2017  Patient: Beth Long, MRN: [id number], DOB: Sep 14, 1980, 37 y.o.  Primary Physician:  Owens Loffler, MD   Chief Complaint  Patient presents with  . Annual Exam   Subjective:   Beth Long is a 37 y.o. pleasant patient who presents with the following:  Health Maintenance Summary Reviewed and updated, unless pt declines services.  Tobacco History Reviewed. Non-smoker Alcohol: No concerns, no excessive use Exercise Habits: Some activity, rec at least 30 mins 5 times a week STD concerns: none Drug Use: None Birth control method: Menses regular: yes Lumps or breast concerns: no Breast Cancer Family History: no  Working at [employer].  Much better work situation.   Vit D?    Health Maintenance  Topic Date Due  . HIV Screening  06/29/1993  . INFLUENZA VACCINE  12/27/2017  . PAP SMEAR  05/12/2020  . TETANUS/TDAP  01/18/2027    Immunization History  Administered Date(s) Administered  . Influenza, Seasonal, Injecte, Preservative Fre 02/15/2010  . Influenza,inj,Quad PF,6+ Mos 07/06/2014, 03/28/2017  . Influenza-Unspecified 03/30/2015  . PPD Test 01/17/2017  . Tdap 01/17/2017   Patient Active Problem List   Diagnosis Date Noted  . COMMON MIGRAINE 08/08/2010  . 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 FATIGUE 12/21/2009  . GAD (generalized anxiety disorder) 12/29/2008  . NEPHROLITHIASIS, HX OF 12/29/2008   Past Medical History:  Diagnosis Date  . Anemia   . Anxiety   . History of nephrolithiasis    Past Surgical History:  Procedure Laterality Date  . BREAST REDUCTION SURGERY     Social History   Socioeconomic History  . Marital status: Married    Spouse name: Not on file  . Number of children: Not on file  . Years of education: Not on file  .  Highest education level: Not on file  Occupational History  . Occupation: school Mudlogger: [employer]  . Financial resource strain: Not on file  . Food insecurity:    Worry: Not on file    Inability: Not on file  . Transportation needs:    Medical: Not on file    Non-medical: Not on file  Tobacco Use  . Smoking status: Former Research scientist (life sciences)  . Smokeless tobacco: Never Used  Substance and Sexual Activity  . Alcohol use: Yes  . Drug use: No  . Sexual activity: Not on file  Lifestyle  . Physical activity:    Days per week: Not on file    Minutes per session: Not on file  . Stress: Not on file  Relationships  . Social connections:    Talks on phone: Not on file    Gets together: Not on file    Attends religious service: Not on file    Active member of club or organization: Not on file    Attends meetings of clubs or organizations: Not on file    Relationship status: Not on file  . Intimate partner violence:    Fear of current or ex partner: Not on file    Emotionally abused: Not on file    Physically abused: Not on file    Forced sexual activity: Not on file  Other Topics Concern  . Not on file  Social History Narrative   Regular exercise-yes   History reviewed. No pertinent family  history. Allergies  Allergen Reactions  . Amoxicillin     REACTION: rash from head to toe    Medication list has been reviewed and updated.   General: Denies fever, chills, sweats. No significant weight loss. Eyes: Denies blurring,significant itching ENT: Denies earache, sore throat, and hoarseness.  Cardiovascular: Denies chest pains, palpitations, dyspnea on exertion,  Respiratory: Denies cough, dyspnea at rest,wheeezing Breast: no concerns about lumps GI: Denies nausea, vomiting, diarrhea, constipation, change in bowel habits, abdominal pain, melena, hematochezia GU: Denies dysuria, hematuria, urinary hesitancy, nocturia, denies STD risk, no concerns  about discharge Musculoskeletal: Denies back pain, joint pain Derm: Denies rash, itching Neuro: Denies  paresthesias, frequent falls, frequent headaches Psych: Denies depression, anxiety Endocrine: Denies cold intolerance, heat intolerance, polydipsia Heme: Denies enlarged lymph nodes Allergy: No hayfever  Objective:   BP 115/85   Pulse (!) 108   Temp 98.7 F (37.1 C) (Oral)   Ht '5\' 7"'  (1.702 m)   Wt 137 lb 8 oz (62.4 kg)   BMI 21.54 kg/m  No exam data present  GEN: well developed, well nourished, no acute distress Eyes: conjunctiva and lids normal, PERRLA, EOMI ENT: TM clear, nares clear, oral exam WNL Neck: supple, no lymphadenopathy, no thyromegaly, no JVD Pulm: clear to auscultation and percussion, respiratory effort normal CV: regular rate and rhythm, S1-S2, no murmur, rub or gallop, no bruits Chest: no scars, masses, no lumps BREAST: breast exam declined GI: soft, non-tender; no hepatosplenomegaly, masses; active bowel sounds all quadrants GU: GU exam declined Lymph: no cervical, axillary or inguinal adenopathy MSK: gait normal, muscle tone and strength WNL, no joint swelling, effusions, discoloration, crepitus  SKIN: clear, good turgor, color WNL, no rashes, lesions, or ulcerations Neuro: normal mental status, normal strength, sensation, and motion Psych: alert; oriented to person, place and time, normally interactive and not anxious or depressed in appearance.   All labs reviewed with patient. Lipids:    Component Value Date/Time   CHOL 197 11/09/2017 0817   TRIG 98.0 11/09/2017 0817   HDL 60.30 11/09/2017 0817   VLDL 19.6 11/09/2017 0817   CHOLHDL 3 11/09/2017 0817   CBC: CBC Latest Ref Rng & Units 11/09/2017 11/06/2016 07/06/2014  WBC 4.0 - 10.5 K/uL 5.7 7.1 8.5  Hemoglobin 12.0 - 15.0 g/dL 14.0 14.2 13.8  Hematocrit 36.0 - 46.0 % 40.6 41.3 40.4  Platelets 150.0 - 400.0 K/uL 321.0 323.0 833.8    Basic Metabolic Panel:    Component Value Date/Time   NA  138 11/09/2017 0817   K 4.5 11/09/2017 0817   CL 105 11/09/2017 0817   CO2 24 11/09/2017 0817   BUN 16 11/09/2017 0817   CREATININE 0.72 11/09/2017 0817   GLUCOSE 81 11/09/2017 0817   CALCIUM 9.1 11/09/2017 0817   Hepatic Function Latest Ref Rng & Units 11/09/2017 11/06/2016 07/06/2014  Total Protein 6.0 - 8.3 g/dL 7.1 7.7 7.8  Albumin 3.5 - 5.2 g/dL 3.9 4.4 4.2  AST 0 - 37 U/L '18 18 25  ' ALT 0 - 35 U/L '18 15 23  ' Alk Phosphatase 39 - 117 U/L 32(L) 46 44  Total Bilirubin 0.2 - 1.2 mg/dL 0.4 0.3 0.4  Bilirubin, Direct 0.0 - 0.3 mg/dL 0.1 0.0 0.1    Lab Results  Component Value Date   TSH 1.09 11/09/2017   No results found.  Assessment and Plan:   Healthcare maintenance  Health Maintenance Exam: The patient's preventative maintenance and recommended screening tests for an annual wellness exam were reviewed in full  today. Brought up to date unless services declined.  Counselled on the importance of diet, exercise, and its role in overall health and mortality. The patient's FH and SH was reviewed, including their home life, tobacco status, and drug and alcohol status.  Follow-up in 1 year for physical exam or additional follow-up below.  Follow-up: No follow-ups on file. Or follow-up in 1 year if not noted.  Signed,  Maud Deed. Minha Fulco, MD   Allergies as of 11/14/2017      Reactions   Amoxicillin    REACTION: rash from head to toe      Medication List        Accurate as of 11/14/17 10:48 AM. Always use your most recent med list.          JUNEL 1.5/30 1.5-30 MG-MCG tablet Generic drug:  Norethindrone Acetate-Ethinyl Estradiol TAKE 1 ACTIVE TABLET DAILY. TAKE IN A CONTINUOUS FASHION   lisdexamfetamine 60 MG capsule Commonly known as:  VYVANSE Take 1 capsule (60 mg total) by mouth every morning.   LORazepam 1 MG tablet Commonly known as:  ATIVAN TAKE 1/2 TABLET BY MOUTH EVERY 8 HOURS AS NEEDED   valACYclovir 1000 MG tablet Commonly known as:  VALTREX TAKE 2  TABLETS (2,000 MG TOTAL) BY MOUTH 2 (TWO) TIMES DAILY AS DIRECTED.   VIIBRYD 40 MG Tabs Generic drug:  Vilazodone HCl TAKE 1 TABLET BY MOUTH EVERY DAY

## 2017-11-14 ENCOUNTER — Ambulatory Visit (INDEPENDENT_AMBULATORY_CARE_PROVIDER_SITE_OTHER): Payer: BC Managed Care – PPO | Admitting: Family Medicine

## 2017-11-14 ENCOUNTER — Other Ambulatory Visit: Payer: Self-pay

## 2017-11-14 VITALS — BP 115/85 | HR 108 | Temp 98.7°F | Ht 67.0 in | Wt 137.5 lb

## 2017-11-14 DIAGNOSIS — Z0000 Encounter for general adult medical examination without abnormal findings: Secondary | ICD-10-CM

## 2017-11-14 MED ORDER — LISDEXAMFETAMINE DIMESYLATE 60 MG PO CAPS: 60.0000 mg | ORAL_CAPSULE | ORAL | 0 refills | Status: DC

## 2018-01-01 ENCOUNTER — Encounter: Payer: Self-pay | Admitting: Family Medicine

## 2018-01-01 ENCOUNTER — Telehealth: Payer: Self-pay | Admitting: Family Medicine

## 2018-01-01 MED ORDER — VILAZODONE HCL 40 MG PO TABS: 40.0000 mg | ORAL_TABLET | Freq: Every day | ORAL | 1 refills | Status: DC

## 2018-01-01 NOTE — Telephone Encounter (Signed)
Copied from CRM [id number]. Topic: Quick Communication - See Telephone Encounter >> Jan 01, 2018  9:35 AM Waymon AmatoBurton, Donna F wrote: Pt is checking on status of the refill request on  vibryd 40 mg   [facility] [address] [phone]

## 2018-01-21 ENCOUNTER — Other Ambulatory Visit: Payer: Self-pay | Admitting: Family Medicine

## 2018-02-21 ENCOUNTER — Other Ambulatory Visit: Payer: Self-pay | Admitting: Family Medicine

## 2018-02-21 MED ORDER — LISDEXAMFETAMINE DIMESYLATE 60 MG PO CAPS: 60.0000 mg | ORAL_CAPSULE | ORAL | 0 refills | Status: DC

## 2018-02-21 NOTE — Telephone Encounter (Signed)
Last office visit 11/14/2017 for CPE. No future appointments.  Last refilled 11/14/2017 x 3 months.  Ok to refill?

## 2018-03-21 ENCOUNTER — Other Ambulatory Visit: Payer: Self-pay | Admitting: Family Medicine

## 2018-03-21 ENCOUNTER — Encounter: Payer: Self-pay | Admitting: Family Medicine

## 2018-04-21 ENCOUNTER — Other Ambulatory Visit: Payer: Self-pay | Admitting: Family Medicine

## 2018-04-21 NOTE — Telephone Encounter (Signed)
Last office visit 11/14/17 for CPE.  Last refilled 10/16/17 for #30 with 3 refills.  No future appointments.

## 2018-04-22 ENCOUNTER — Other Ambulatory Visit: Payer: Self-pay | Admitting: Family Medicine

## 2018-04-23 MED ORDER — LORAZEPAM 1 MG PO TABS: ORAL_TABLET | ORAL | 0 refills | Status: DC

## 2018-04-23 NOTE — Telephone Encounter (Signed)
[facility] [address]

## 2018-04-23 NOTE — Telephone Encounter (Signed)
[facility] [address]

## 2018-04-23 NOTE — Telephone Encounter (Signed)
Okay to refill but Epic won't let me send in through computer with change in pharmacy.. Please call in

## 2018-04-24 NOTE — Telephone Encounter (Signed)
[facility] [place] [facility] and gave verbal okay to fill Vyvanse early due to patient is going out of town for Thanksgiving.  Jaedan notified via MyChart.

## 2018-04-24 NOTE — Addendum Note (Signed)
Addended by: Desmond DikeKNIGHT, Ritesh Opara H on: 04/24/2018 10:04 AM   Modules accepted: Orders

## 2018-05-19 ENCOUNTER — Other Ambulatory Visit: Payer: Self-pay | Admitting: Family Medicine

## 2018-05-20 ENCOUNTER — Encounter: Payer: Self-pay | Admitting: Family Medicine

## 2018-05-20 ENCOUNTER — Other Ambulatory Visit: Payer: Self-pay | Admitting: Family Medicine

## 2018-05-20 MED ORDER — LISDEXAMFETAMINE DIMESYLATE 60 MG PO CAPS: 60.0000 mg | ORAL_CAPSULE | ORAL | 0 refills | Status: DC

## 2018-05-20 NOTE — Telephone Encounter (Signed)
Electronic refill request Last office visit 11/14/17 CPE Upcoming appointment none scheduled [place] refill 04/27/18 #30

## 2018-05-21 NOTE — Telephone Encounter (Signed)
Please call the pharmacy and have them fill the Vyvanse today. Let me know if I do need to resend the prescription.. I would prefer not to given it has Dr. Cyndie Chimeopland's signature.   Beth Long, given this is a repeat issue each month.. you may want to send the Rxs in again for January and February.

## 2018-05-21 NOTE — Telephone Encounter (Signed)
Called and spoke to [name] [facility] [name] advised as instructed regarding filling the prescription today per Dr. Ermalene SearingBedsole. [name] stated that she will over ride the fill date today and will fill the script, [name] stated that patient had the script filled 04/24/18 and should have 3 pills left, but will go ahead and fill the script today as instructed.

## 2018-05-23 MED ORDER — LISDEXAMFETAMINE DIMESYLATE 60 MG PO CAPS: 60.0000 mg | ORAL_CAPSULE | ORAL | 0 refills | Status: DC

## 2018-05-23 NOTE — Telephone Encounter (Signed)
Date corrections made

## 2018-06-10 ENCOUNTER — Other Ambulatory Visit: Payer: Self-pay | Admitting: Family Medicine

## 2018-06-10 NOTE — Telephone Encounter (Signed)
Last office visit 11/14/2017 for CPE.  Last refilled 04/23/2018 for #30 with no refills by Dr. Ermalene Searing.  No future appointments.

## 2018-06-19 ENCOUNTER — Encounter: Payer: Self-pay | Admitting: Family Medicine

## 2018-06-19 MED ORDER — VALACYCLOVIR HCL 1 G PO TABS: 2000.0000 mg | ORAL_TABLET | Freq: Two times a day (BID) | ORAL | 2 refills | Status: DC

## 2018-06-19 NOTE — Telephone Encounter (Signed)
Last office visit 11/14/2017 for CPE.  Last refilled 01/11/2017 for #20 with 2 refills.  No future appointments.

## 2018-06-21 ENCOUNTER — Telehealth: Payer: Self-pay | Admitting: *Deleted

## 2018-06-21 ENCOUNTER — Other Ambulatory Visit: Payer: Self-pay | Admitting: Family Medicine

## 2018-06-21 NOTE — Telephone Encounter (Signed)
[facility] for Vyvanse 60 mg.  PA completed on CoverMyMeds.  Sent for review.  Can take up to 72 hours for a decision.

## 2018-06-21 NOTE — Telephone Encounter (Signed)
[facility] They still have 2 Vyvanse RXs on file for the patient from where it was sent in on 05/23/2018 by Dr. Patsy Lager. Called patient and advised of this information. Advised patient to give [place] a call if she has any trouble with this.

## 2018-06-21 NOTE — Telephone Encounter (Signed)
Electronic refill request. Vyvanse Last office visit:

## 2018-06-26 NOTE — Telephone Encounter (Signed)
Response from CoverMymeds: Your PA request has been closed. Your ePA request has been aborted. No PA required for Vyvanse 60mg  one pill daily.  [facility] notified of [facility] via [facility].

## 2018-06-28 ENCOUNTER — Encounter: Payer: Self-pay | Admitting: Family Medicine

## 2018-06-28 MED ORDER — VILAZODONE HCL 40 MG PO TABS: 40.0000 mg | ORAL_TABLET | Freq: Every day | ORAL | 1 refills | Status: DC

## 2018-06-28 NOTE — Telephone Encounter (Signed)
Last office visit 11/14/2017.  Last refilled 01/01/2018 for #90 with 1 refill.  No future appointments.

## 2018-08-21 ENCOUNTER — Other Ambulatory Visit: Payer: Self-pay | Admitting: Family Medicine

## 2018-08-21 MED ORDER — LISDEXAMFETAMINE DIMESYLATE 60 MG PO CAPS: 60.0000 mg | ORAL_CAPSULE | ORAL | 0 refills | Status: DC

## 2018-08-21 NOTE — Telephone Encounter (Signed)
Last office visit 11/14/2017 for CPE.  Last refilled 05/20/18 x 3 months.  No future appointments. No UDS/Contract on file.

## 2018-10-15 ENCOUNTER — Encounter: Payer: Self-pay | Admitting: Family Medicine

## 2018-10-16 ENCOUNTER — Ambulatory Visit (INDEPENDENT_AMBULATORY_CARE_PROVIDER_SITE_OTHER): Payer: BC Managed Care – PPO | Admitting: Family Medicine

## 2018-10-16 ENCOUNTER — Encounter: Payer: Self-pay | Admitting: Family Medicine

## 2018-10-16 VITALS — HR 103 | Ht 67.0 in | Wt 137.0 lb

## 2018-10-16 DIAGNOSIS — G4700 Insomnia, unspecified: Secondary | ICD-10-CM

## 2018-10-16 DIAGNOSIS — F411 Generalized anxiety disorder: Secondary | ICD-10-CM | POA: Diagnosis not present

## 2018-10-16 NOTE — Progress Notes (Signed)
Mazin Emma T. Nalaya Wojdyla, MD Primary Care and Sports Medicine [facility] [address] [place], [address] Phone: [phone]  FAX: [phone]  Beth Long - 38 y.o. female  MRN [id number]  Date of Birth: 03/15/1981  Visit Date: 10/16/2018  PCP: Hannah Beat, MD  Referred by: Hannah Beat, MD Chief Complaint  Patient presents with  . Anxiety   Virtual Visit via Video Note:  I connected with  Beth Long on 10/16/2018  2:20 PM EDT by a video enabled telemedicine application and verified that I am speaking with the correct person using two identifiers.   Location patient: home computer, tablet, or smartphone Location provider: work or home office Consent: Verbal consent directly obtained from [employer]. Persons participating in the virtual visit: patient, provider  I discussed the limitations of evaluation and management by telemedicine and the availability of in person appointments. The patient expressed understanding and agreed to proceed.  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  She has had some anxiety and depression for a long time, and she is on Viibryd and has done well.  She calls in today with some ongoing issues with anxiety as well as some trouble sleeping.  Her anxiety is worsened quite a bit over the last few months.  She finds that she is having to take her Ativan more frequently than previously, and she has been very judicious in the past.  She continues to be on Viibryd 20 mg, and this is been a longstanding dose.  For sleep she has tried melatonin 5 mg, but she is waking up sometimes.  She also has tried some valerian root, this does not seem to have helped much.  She has cut back her caffeine intake.  She has not taken over-the-counter sleep medication such as Benadryl, because they leave her drowsy in the morning.  She is not really currently depressed.  Review of  Systems as above: See pertinent positives and pertinent negatives per HPI No acute distress verbally  Past Medical History, Surgical History, Social History, Family History, Problem List, Medications, and Allergies have been reviewed and updated if relevant.   Observations/Objective/Exam:  An attempt was made to discern vital signs over the phone and per patient if applicable and possible.   General:    Alert, Oriented, appears well and in no acute distress HEENT:     Atraumatic, conjunctiva clear, no obvious abnormalities on inspection of external nose and ears.  Neck:    Normal movements of the head and neck Pulmonary:     On inspection no signs of respiratory distress, breathing rate appears normal, no obvious gross SOB, gasping or wheezing Cardiovascular:    No obvious cyanosis Musculoskeletal:    Moves all visible extremities without noticeable abnormality Psych / Neurological:     Pleasant and cooperative, no obvious depression or anxiety, speech and thought processing grossly intact  Assessment and Plan:  GAD (generalized anxiety disorder)  Insomnia, unspecified type  Increase Viibryd dosing to 1 tablet 1 day, then half a table the next day, alternating doses.  Functional equivalent to 30 mg a day.  Increase melatonin dosage to 10 mg before bed.  Trial of this for several days.  If this does not help with nighttime awakenings, then add Benadryl 12.5 mg prior to sleep.  Continue with decreased caffeine, increased exercise.  If anxiety stabilizes throughout the summer and school resumes, then I think is  reasonable to go back down on her 20 mg of Viibryd.  I discussed the assessment and treatment plan with the patient. The patient was provided an opportunity to ask questions and all were answered. The patient agreed with the plan and demonstrated an understanding of the instructions.   The patient was advised to call back or seek an in-person evaluation if the symptoms  worsen or if the condition fails to improve as anticipated.  Follow-up: prn unless noted otherwise below No follow-ups on file.  No orders of the defined types were placed in this encounter.  No orders of the defined types were placed in this encounter.   Signed,  Elpidio GaleaSpencer T. Makynli Stills, MD

## 2018-11-05 ENCOUNTER — Other Ambulatory Visit: Payer: Self-pay | Admitting: Family Medicine

## 2018-11-06 NOTE — Telephone Encounter (Signed)
Last office visit 10/16/2018 for GAD.  Last refilled 06/10/2018 for #30 with 3 refills.  No future appointments.

## 2018-11-21 ENCOUNTER — Other Ambulatory Visit: Payer: Self-pay | Admitting: Family Medicine

## 2018-11-21 MED ORDER — LISDEXAMFETAMINE DIMESYLATE 60 MG PO CAPS: 60.0000 mg | ORAL_CAPSULE | ORAL | 0 refills | Status: DC

## 2018-11-21 NOTE — Telephone Encounter (Signed)
Last office visit 10/16/2018 for GAD.  Last refilled 08/21/2018 for #30 with no refills x 3 months.  No future appointments.

## 2018-12-19 ENCOUNTER — Other Ambulatory Visit: Payer: Self-pay | Admitting: Family Medicine

## 2018-12-19 NOTE — Telephone Encounter (Signed)
Pt should have available rx for vyvanse 60 mg thru 01/21/19. I spoke with Danae Chen at [facility] and they do have Vyvanse rx on hold and will get ready for pick up on 12/21/18. [facility]. Pt notified and voiced understanding.

## 2019-01-22 ENCOUNTER — Other Ambulatory Visit: Payer: Self-pay | Admitting: Family Medicine

## 2019-02-19 ENCOUNTER — Other Ambulatory Visit: Payer: Self-pay | Admitting: Family Medicine

## 2019-02-19 MED ORDER — LISDEXAMFETAMINE DIMESYLATE 60 MG PO CAPS: 60.0000 mg | ORAL_CAPSULE | ORAL | 0 refills | Status: DC

## 2019-02-19 NOTE — Telephone Encounter (Signed)
Last office visit 10/16/2018 for GAD.  Last refilled 11/21/2018 for #30 x 3 months.  No future appointments.

## 2019-05-05 ENCOUNTER — Other Ambulatory Visit: Payer: Self-pay | Admitting: Family Medicine

## 2019-05-14 ENCOUNTER — Encounter: Payer: Self-pay | Admitting: Family Medicine

## 2019-05-14 ENCOUNTER — Other Ambulatory Visit: Payer: Self-pay

## 2019-05-14 ENCOUNTER — Ambulatory Visit: Payer: BC Managed Care – PPO | Admitting: Family Medicine

## 2019-05-14 VITALS — BP 120/90 | HR 99 | Temp 97.9°F | Ht 67.0 in | Wt 147.0 lb

## 2019-05-14 DIAGNOSIS — F411 Generalized anxiety disorder: Secondary | ICD-10-CM

## 2019-05-14 MED ORDER — VORTIOXETINE HBR 5 MG PO TABS: ORAL_TABLET | ORAL | 2 refills | Status: DC

## 2019-05-14 MED ORDER — VORTIOXETINE HBR 5 MG PO TABS: ORAL_TABLET | ORAL | 0 refills | Status: DC

## 2019-05-14 NOTE — Progress Notes (Signed)
Beth Eakins T. Cuong Moorman, MD Primary Care and [place] at [facility] Beth Long [place], [address] Phone: [phone]  FAX: [phone]  Beth Long - 38 y.o. female  MRN [id number]  Date of Birth: 03-31-1981  Visit Date: 05/14/2019  PCP: Beth Loffler, MD  Referred by: Beth Loffler, MD  Chief Complaint  Patient presents with  . Medication Management    Insurance no longer covering Viibryd    This visit occurred during the SARS-CoV-2 public health emergency.  Safety protocols were in place, including screening questions prior to the visit, additional usage of staff PPE, and extensive cleaning of exam room while observing appropriate contact time as indicated for disinfecting solutions.   Subjective:   Beth Long is a 38 y.o. very pleasant female patient who presents with the following:  She known patient and she is primarily had some generalized anxiety in the past along with some intermittent depression and she does also take some Vyvanse for ADD.  Adult onset.  He is here today, because her insurance plan no longer covers Viibryd, and she is here to discuss other possible options.  Trintellix?  Upon review, this medication is an atypical antidepressant which is the most similar to her prior Viibryd.  She brings in her formulary from [facility] and this appears to be a preferred medication.  Past Medical History, Surgical History, Social History, Family History, Problem List, Medications, and Allergies have been reviewed and updated if relevant.  Patient Active Problem List   Diagnosis Date Noted  . COMMON MIGRAINE 08/08/2010  . Major depression in complete remission ([place]) 04/04/2010  . GAD (generalized anxiety disorder) 12/29/2008  . NEPHROLITHIASIS, HX OF 12/29/2008    Past Medical History:  Diagnosis Date  . Anemia   . Anxiety   . History of nephrolithiasis     Past Surgical History:   Procedure Laterality Date  . BREAST REDUCTION SURGERY      Social History   Socioeconomic History  . Marital status: Married    Spouse name: Not on file  . Number of children: Not on file  . Years of education: Not on file  . Highest education level: Not on file  Occupational History  . Occupation: school Mudlogger: [employer]  Tobacco Use  . Smoking status: Former Research scientist (life sciences)  . Smokeless tobacco: Never Used  Substance and Sexual Activity  . Alcohol use: Yes  . Drug use: No  . Sexual activity: Not on file  Other Topics Concern  . Not on file  Social History Narrative   Regular exercise-yes   Social Determinants of Health   Financial Resource Strain:   . Difficulty of Paying Living Expenses: Not on file  Food Insecurity:   . Worried About Charity fundraiser in the Last Year: Not on file  . Ran Out of Food in the Last Year: Not on file  Transportation Needs:   . Lack of Transportation (Medical): Not on file  . Lack of Transportation (Non-Medical): Not on file  Physical Activity:   . Days of Exercise per Week: Not on file  . Minutes of Exercise per Session: Not on file  Stress:   . Feeling of Stress : Not on file  Social Connections:   . Frequency of Communication with Friends and Family: Not on file  . Frequency of Social Gatherings with Friends and Family: Not on file  . Attends Religious Services: Not  on file  . Active Member of Clubs or Organizations: Not on file  . Attends Banker Meetings: Not on file  . Marital Status: Not on file  Intimate Partner Violence:   . Fear of Current or Ex-Partner: Not on file  . Emotionally Abused: Not on file  . Physically Abused: Not on file  . Sexually Abused: Not on file    No family history on file.  Allergies  Allergen Reactions  . Amoxicillin     REACTION: rash from head to toe    Medication list reviewed and updated in full in [facility].   GEN: No acute illnesses, no  fevers, chills. GI: No n/v/d, eating normally Pulm: No SOB Interactive and getting along well at home.  Otherwise, ROS is as per the HPI.  Objective:   BP 120/90   Pulse 99   Temp 97.9 F (36.6 C) (Temporal)   Ht 5\' 7"  (1.702 m)   Wt 147 lb (66.7 kg)   SpO2 98%   BMI 23.02 kg/m   GEN: WDWN, NAD, Non-toxic, A & O x 3 HEENT: Atraumatic, Normocephalic. Neck supple. No masses, No LAD. Ears and Nose: No external deformity. EXTR: No c/c/e NEURO Normal gait.  PSYCH: Normally interactive. Conversant. Not depressed or anxious appearing.  Calm demeanor.   Laboratory and Imaging Data:  Assessment and Plan:     ICD-10-CM   1. GAD (generalized anxiety disorder)  F41.1    Patient Instructions  Take 1 tablet for 1 week, then increase to 2 tablets daily.  Let me know how you are doing in 6 weeks regardless.  If needed, then we can go up to 20 mg.    Follow-up: No follow-ups on file.  Meds ordered this encounter  Medications  . DISCONTD: vortioxetine HBr (TRINTELLIX) 5 MG TABS tablet    Sig: 1 tablet po daily for 1 week, then increase to 2 tablets po for 3 weeks.    Dispense:  60 tablet    Refill:  0  . vortioxetine HBr (TRINTELLIX) 5 MG TABS tablet    Sig: 1 tablet po for 1 week, then increase to 2 tablets daily    Dispense:  60 tablet    Refill:  2   No orders of the defined types were placed in this encounter.   Signed,  . Ercilia Bettinger, MD   Outpatient Encounter Medications as of 05/14/2019  Medication Sig  . JUNEL 1.5/30 1.5-30 MG-MCG tablet TAKE 1 ACTIVE TABLET DAILY. TAKE IN A CONTINUOUS FASHION  . lisdexamfetamine (VYVANSE) 60 MG capsule Take 1 capsule (60 mg total) by mouth every morning.  . lisdexamfetamine (VYVANSE) 60 MG capsule Take 1 capsule (60 mg total) by mouth every morning.  . lisdexamfetamine (VYVANSE) 60 MG capsule Take 1 capsule (60 mg total) by mouth every morning.  04-01-1980 LORazepam (ATIVAN) 1 MG tablet TAKE 1/2 TAB BY MOUTH EVERY 8 HOURS AS  NEEDED  . valACYclovir (VALTREX) 1000 MG tablet Take 2 tablets (2,000 mg total) by mouth 2 (two) times daily. As directed  . VIIBRYD 40 MG TABS TAKE 1 TABLET BY MOUTH EVERY DAY  . vortioxetine HBr (TRINTELLIX) 5 MG TABS tablet 1 tablet po for 1 week, then increase to 2 tablets daily  . [DISCONTINUED] vortioxetine HBr (TRINTELLIX) 5 MG TABS tablet 1 tablet po daily for 1 week, then increase to 2 tablets po for 3 weeks.   No facility-administered encounter medications on file as of 05/14/2019.

## 2019-05-14 NOTE — Patient Instructions (Addendum)
Take 1 tablet for 1 week, then increase to 2 tablets daily.  Let me know how you are doing in 6 weeks regardless.  If needed, then we can go up to 20 mg.

## 2019-05-22 ENCOUNTER — Other Ambulatory Visit: Payer: Self-pay | Admitting: Family Medicine

## 2019-05-22 MED ORDER — LISDEXAMFETAMINE DIMESYLATE 60 MG PO CAPS: 60.0000 mg | ORAL_CAPSULE | ORAL | 0 refills | Status: DC

## 2019-05-22 NOTE — Telephone Encounter (Signed)
Name of Medication:vyvanse 60 mg  Name of Pharmacy: [facility] or Written Date and Quantity: # 30 x 2 on 02/19/19 Last Office Visit and Type: 05/14/19 med mgt Next Office Visit and Type: none scheduled

## 2019-05-31 ENCOUNTER — Encounter: Payer: Self-pay | Admitting: Family Medicine

## 2019-06-02 ENCOUNTER — Other Ambulatory Visit: Payer: Self-pay | Admitting: Family Medicine

## 2019-06-07 ENCOUNTER — Other Ambulatory Visit: Payer: Self-pay | Admitting: Family Medicine

## 2019-06-07 MED ORDER — LORAZEPAM 1 MG PO TABS: ORAL_TABLET | ORAL | 3 refills | Status: DC

## 2019-06-17 ENCOUNTER — Ambulatory Visit: Payer: BC Managed Care – PPO | Attending: Internal Medicine

## 2019-06-17 DIAGNOSIS — Z20822 Contact with and (suspected) exposure to covid-19: Secondary | ICD-10-CM

## 2019-06-18 ENCOUNTER — Encounter: Payer: Self-pay | Admitting: Family Medicine

## 2019-06-18 LAB — NOVEL CORONAVIRUS, NAA: SARS-CoV-2, NAA: NOT DETECTED

## 2019-06-19 NOTE — Telephone Encounter (Signed)
I spoke to Beth Long on the phone.  She had her COVID-19 testing 4 days after onset of symptoms.  She is not having any runny nose or typical cold symptoms.  Symptoms are as above.  She is not having any loss of taste or smell.  Nevertheless, I suspect that she probably has a false negative on her COVID-19 testing.  Recommended 14-day quarantine.  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

## 2019-06-25 ENCOUNTER — Other Ambulatory Visit: Payer: Self-pay

## 2019-06-25 MED ORDER — LISDEXAMFETAMINE DIMESYLATE 60 MG PO CAPS: 60.0000 mg | ORAL_CAPSULE | ORAL | 0 refills | Status: DC

## 2019-06-25 NOTE — Telephone Encounter (Signed)
Last office visit 05/24/2019 for GAD.  Last refilled 05/22/2019 by Dr. Ermalene Searing for #30 with no refills.  No future appointments.

## 2019-06-30 ENCOUNTER — Ambulatory Visit (INDEPENDENT_AMBULATORY_CARE_PROVIDER_SITE_OTHER): Payer: BC Managed Care – PPO | Admitting: Family Medicine

## 2019-06-30 ENCOUNTER — Encounter: Payer: Self-pay | Admitting: Family Medicine

## 2019-06-30 ENCOUNTER — Other Ambulatory Visit: Payer: Self-pay

## 2019-06-30 VITALS — Ht 67.0 in

## 2019-06-30 DIAGNOSIS — F411 Generalized anxiety disorder: Secondary | ICD-10-CM | POA: Diagnosis not present

## 2019-06-30 DIAGNOSIS — F325 Major depressive disorder, single episode, in full remission: Secondary | ICD-10-CM

## 2019-06-30 DIAGNOSIS — F410 Panic disorder [episodic paroxysmal anxiety] without agoraphobia: Secondary | ICD-10-CM

## 2019-06-30 MED ORDER — VORTIOXETINE HBR 5 MG PO TABS: 15.0000 mg | ORAL_TABLET | Freq: Every day | ORAL | 1 refills | Status: DC

## 2019-06-30 NOTE — Progress Notes (Addendum)
Beth Araiza T. Renuka Farfan, MD Primary Care and Sports Medicine [facility] at [facility] [address] [place], [address] Phone: [phone]  FAX: [phone]  Beth Long - 39 y.o. female  MRN [id number]  Date of Birth: 1980/10/30  Visit Date: 06/30/2019  PCP: Hannah Beat, MD  Referred by: Hannah Beat, MD Chief Complaint  Patient presents with  . Follow-up    Medication   Virtual Visit via Video Note:  I connected with  Beth Long on 06/30/2019  2:40 PM EST by a video enabled telemedicine application and verified that I am speaking with the correct person using two identifiers.   Location patient: home computer, tablet, or smartphone Location provider: work or home office Consent: Verbal consent directly obtained from [employer]. Persons participating in the virtual visit: patient, provider  I discussed the limitations of evaluation and management by telemedicine and the availability of in person appointments. The patient expressed understanding and agreed to proceed.  History of Present Illness:  F.u meds.  Now on Trintellix 10 mg.  See below  Review of Systems as above: See pertinent positives and pertinent negatives per HPI No acute distress verbally  Past Medical History, Surgical History, Social History, Family History, Problem List, Medications, and Allergies have been reviewed and updated if relevant.   Observations/Objective/Exam:  An attempt was made to discern vital signs over the phone and per patient if applicable and possible.   General:    Alert, Oriented, appears well and in no acute distress HEENT:     Atraumatic, conjunctiva clear, no obvious abnormalities on inspection of external nose and ears.  Neck:    Normal movements of the head and neck Pulmonary:     On inspection no signs of respiratory distress, breathing rate appears normal, no obvious gross SOB, gasping or wheezing Cardiovascular:  No obvious cyanosis Musculoskeletal:    Moves all visible extremities without noticeable abnormality Psych / Neurological:     Pleasant and cooperative, no obvious depression or anxiety, speech and thought processing grossly intact  Assessment and Plan:    ICD-10-CM   1. GAD (generalized anxiety disorder)  F41.1   2. Panic attacks  F41.0    Addendum: I inadvertantly marked this as an in-office visit, but this was a video visit.  Level of Medical Decision-Making in this case is low  We had changed her from Viibryd to [employer].  In the transition she did have quite a bit of anxiety and some also depression around Christmas time.  For the last 2 or 3 weeks she has improved.  Initially she was on 5 mg of Trintellix daily, now she is on 10 mg.  This is been stable for approximately 5 weeks.  She still is having some anxiety and she is having to take 1 lorazepam up approximately daily.  She denies any other depression symptoms including anhedonia.  She is not crying at all.  She does have a lack of energy, but she is recently been quite sick.  Anxiety has been improving, but it is not returned to baseline.  Increase her Trintellix dose to 15 mg daily.  I discussed the assessment and treatment plan with the patient. The patient was provided an opportunity to ask questions and all were answered. The patient agreed with the plan and demonstrated an understanding of the instructions.   The patient was advised to call back or seek an in-person evaluation if the symptoms worsen or if the condition fails to  improve as anticipated.  Follow-up: prn unless noted otherwise below No follow-ups on file.  Meds ordered this encounter  Medications  . vortioxetine HBr (TRINTELLIX) 5 MG TABS tablet    Sig: Take 3 tablets (15 mg total) by mouth daily.    Dispense:  270 tablet    Refill:  1   No orders of the defined types were placed in this encounter.   Signed,  Maud Deed. Aleea Hendry, MD

## 2019-07-26 ENCOUNTER — Other Ambulatory Visit: Payer: Self-pay | Admitting: Family Medicine

## 2019-07-28 NOTE — Telephone Encounter (Signed)
Patient should have available refills at her pharmacy for Feb and March 2021.  Patient notified by telephone.

## 2019-08-04 ENCOUNTER — Encounter: Payer: BC Managed Care – PPO | Admitting: Family Medicine

## 2019-08-05 NOTE — Progress Notes (Signed)
[place] Beth T. [name], MD Primary Care and Sports Medicine [facility] at [facility] [address] [place], [address] Phone: [phone]  FAX: [phone]  Beth Long  MRN [id number]  Date of Birth: [birth year]  Visit Date: 08/06/2019  PCP: Hannah Beat, MD  Referred by: Hannah Beat, MD  Chief Complaint  Patient presents with  . Annual Exam    This visit occurred during the SARS-CoV-2 public health emergency.  Safety protocols were in place, including screening questions prior to the visit, additional usage of staff PPE, and extensive cleaning of exam room while observing appropriate contact time as indicated for disinfecting solutions.   Patient Care Team: Hannah Beat, MD as PCP - General Subjective:   Beth Long is a 39 y.o. pleasant patient who presents with the following:  Health Maintenance Summary Reviewed and updated, unless pt declines services.  Tobacco History Reviewed. Non-smoker Alcohol: No concerns, no excessive use - rare Exercise Habits: Some activity, rec at least 30 mins 5 times a week.  Normally better. Wants to go back running.  STD concerns: none Drug Use: None Lumps or breast concerns: no  She is having her pelvic exams and breast exams done at [facility].  Really sleepy still all of the time. Mom has thyroid.   Hair falling out.  Sleeping about 8 hours but some waking up I nthe middle of the night.  Staying a sleep is a dificulty. Will read some Exercising some or at leaast more Everything ok at home Melatonin, benadryl. Nighttime tea.   Better since last spring.     Health Maintenance  Topic Date Due  . PAP SMEAR-Modifier  05/12/2020  . TETANUS/TDAP  01/18/2027  . INFLUENZA VACCINE  Completed  . HIV Screening  Completed    Immunization History  Administered Date(s) Administered  . Influenza, Seasonal, Injecte, Preservative Fre 02/15/2010  . Influenza,inj,Quad  PF,6+ Mos 07/06/2014, 03/28/2017, 02/28/2018  . Influenza-Unspecified 03/30/2015, 02/27/2019  . PPD Test 01/17/2017  . Tdap 01/17/2017   Patient Active Problem List   Diagnosis Date Noted  . COMMON MIGRAINE 08/08/2010  . Major depression in complete remission ([facility]) 04/04/2010  . GAD (generalized anxiety disorder) 12/29/2008  . NEPHROLITHIASIS, HX OF 12/29/2008    Past Medical History:  Diagnosis Date  . Anemia   . Anxiety   . History of nephrolithiasis     Past Surgical History:  Procedure Laterality Date  . BREAST REDUCTION SURGERY      History reviewed. No pertinent family history.  Past Medical History, Surgical History, Social History, Family History, Problem List, Medications, and Allergies have been reviewed and updated if relevant.  Review of Systems: Pertinent positives are listed above.  Otherwise, a full 14 point review of systems has been done in full and it is negative except where it is noted positive.  Objective:   BP 100/78   Pulse 100   Temp 98.2 F (36.8 C) (Temporal)   Ht 5' 6.75" (1.695 m)   Wt 143 lb (64.9 kg)   SpO2 97%   BMI 22.57 kg/m  Ideal Body Weight: Weight in (lb) to have BMI = 25: 158.1 No exam data present Depression screen [facility] 2/9 11/14/2017  Decreased Interest 0  Down, Depressed, Hopeless 0  PHQ - 2 Score 0     GEN: well developed, well nourished, no acute distress Eyes: conjunctiva and lids normal, PERRLA, EOMI ENT: TM clear, nares clear, oral exam WNL Neck: supple, no  lymphadenopathy, no thyromegaly, no JVD Pulm: clear to auscultation and percussion, respiratory effort normal CV: regular rate and rhythm, S1-S2, no murmur, rub or gallop, no bruits Chest: no scars, masses, no lumps BREAST: breast exam declined GI: soft, non-tender; no hepatosplenomegaly, masses; active bowel sounds all quadrants GU: GU exam declined Lymph: no cervical, axillary or inguinal adenopathy MSK: gait normal, muscle tone and strength WNL, no joint  swelling, effusions, discoloration, crepitus  SKIN: clear, good turgor, color WNL, no rashes, lesions, or ulcerations Neuro: normal mental status, normal strength, sensation, and motion Psych: alert; oriented to person, place and time, normally interactive and not anxious or depressed in appearance.   All labs reviewed with patient. Results for orders placed or performed in visit on 06/17/19  Novel Coronavirus, NAA ([facility])   Specimen: Nasopharyngeal(NP) swabs in vial transport medium   NASOPHARYNGE  TESTING  Result Value Ref Range   SARS-CoV-2, NAA Not Detected Not Detected  ds No results found.  Assessment and Plan:     ICD-10-CM   1. Healthcare maintenance  H08.65 Basic metabolic panel    CBC with Differential/Platelet    Hepatic function panel    Lipid panel    Hemoglobin A1c    VITAMIN D 25 Hydroxy (Vit-D Deficiency, Fractures)    TSH    Vitamin B12  2. Other fatigue  R53.83 Hemoglobin A1c    VITAMIN D 25 Hydroxy (Vit-D Deficiency, Fractures)    TSH    Vitamin B12  3. Hair loss  L65.9 TSH   Some insomnia, but globally doing well Doing well with transition to going back to school  Health Maintenance Exam: The patient's preventative maintenance and recommended screening tests for an annual wellness exam were reviewed in full today. Brought up to date unless services declined.  Counselled on the importance of diet, exercise, and its role in overall health and mortality. The patient's FH and SH was reviewed, including their home life, tobacco status, and drug and alcohol status.  Follow-up in 1 year for physical exam or additional follow-up below.  Follow-up: No follow-ups on file. Or follow-up in 1 year if not noted.  No future appointments.  No orders of the defined types were placed in this encounter.  There are no discontinued medications. Orders Placed This Encounter  Procedures  . Basic metabolic panel  . CBC with Differential/Platelet  . Hepatic  function panel  . Lipid panel  . Hemoglobin A1c  . VITAMIN D 25 Hydroxy (Vit-D Deficiency, Fractures)  . TSH  . Vitamin B12    Signed,  Casmer Yepiz T. Tranesha Lessner, MD   Allergies as of 08/06/2019      Reactions   Amoxicillin    REACTION: rash from head to toe      Medication List       Accurate as of August 06, 2019  4:22 PM. If you have any questions, ask your nurse or doctor.        Junel 1.5/30 1.5-30 MG-MCG tablet Generic drug: Norethindrone Acetate-Ethinyl Estradiol TAKE 1 ACTIVE TABLET DAILY. TAKE IN A CONTINUOUS FASHION   lisdexamfetamine 60 MG capsule Commonly known as: Vyvanse Take 1 capsule (60 mg total) by mouth every morning.   lisdexamfetamine 60 MG capsule Commonly known as: Vyvanse Take 1 capsule (60 mg total) by mouth every morning.   lisdexamfetamine 60 MG capsule Commonly known as: Vyvanse Take 1 capsule (60 mg total) by mouth every morning.   LORazepam 1 MG tablet Commonly known as: ATIVAN 1 tab po tid  prn anxiety   valACYclovir 1000 MG tablet Commonly known as: VALTREX Take 2 tablets (2,000 mg total) by mouth 2 (two) times daily. As directed   vortioxetine HBr 5 MG Tabs tablet Commonly known as: Trintellix Take 3 tablets (15 mg total) by mouth daily.

## 2019-08-06 ENCOUNTER — Encounter: Payer: Self-pay | Admitting: Family Medicine

## 2019-08-06 ENCOUNTER — Ambulatory Visit (INDEPENDENT_AMBULATORY_CARE_PROVIDER_SITE_OTHER): Payer: BC Managed Care – PPO | Admitting: Family Medicine

## 2019-08-06 ENCOUNTER — Other Ambulatory Visit: Payer: Self-pay

## 2019-08-06 VITALS — BP 100/78 | HR 100 | Temp 98.2°F | Ht 66.75 in | Wt 143.0 lb

## 2019-08-06 DIAGNOSIS — Z0000 Encounter for general adult medical examination without abnormal findings: Secondary | ICD-10-CM

## 2019-08-06 DIAGNOSIS — R5383 Other fatigue: Secondary | ICD-10-CM

## 2019-08-06 DIAGNOSIS — L659 Nonscarring hair loss, unspecified: Secondary | ICD-10-CM

## 2019-08-07 LAB — CBC WITH DIFFERENTIAL/PLATELET
Basophils Absolute: 0.1 10*3/uL (ref 0.0–0.1)
Basophils Relative: 1.1 % (ref 0.0–3.0)
Eosinophils Absolute: 0.1 10*3/uL (ref 0.0–0.7)
Eosinophils Relative: 1.4 % (ref 0.0–5.0)
HCT: 42.5 % (ref 36.0–46.0)
Hemoglobin: 14.4 g/dL (ref 12.0–15.0)
Lymphocytes Relative: 33.9 % (ref 12.0–46.0)
Lymphs Abs: 3 10*3/uL (ref 0.7–4.0)
MCHC: 34 g/dL (ref 30.0–36.0)
MCV: 93.9 fl (ref 78.0–100.0)
Monocytes Absolute: 0.5 10*3/uL (ref 0.1–1.0)
Monocytes Relative: 6 % (ref 3.0–12.0)
Neutro Abs: 5.2 10*3/uL (ref 1.4–7.7)
Neutrophils Relative %: 57.6 % (ref 43.0–77.0)
Platelets: 393 10*3/uL (ref 150.0–400.0)
RBC: 4.52 Mil/uL (ref 3.87–5.11)
RDW: 13.3 % (ref 11.5–15.5)
WBC: 9 10*3/uL (ref 4.0–10.5)

## 2019-08-07 LAB — VITAMIN D 25 HYDROXY (VIT D DEFICIENCY, FRACTURES): VITD: 58.66 ng/mL (ref 30.00–100.00)

## 2019-08-07 LAB — TSH: TSH: 1.07 u[IU]/mL (ref 0.35–4.50)

## 2019-08-07 LAB — VITAMIN B12: Vitamin B-12: 1319 pg/mL — ABNORMAL HIGH (ref 211–911)

## 2019-08-07 LAB — HEPATIC FUNCTION PANEL
ALT: 16 U/L (ref 0–35)
AST: 17 U/L (ref 0–37)
Albumin: 4.1 g/dL (ref 3.5–5.2)
Alkaline Phosphatase: 48 U/L (ref 39–117)
Bilirubin, Direct: 0.1 mg/dL (ref 0.0–0.3)
Total Bilirubin: 0.4 mg/dL (ref 0.2–1.2)
Total Protein: 7.5 g/dL (ref 6.0–8.3)

## 2019-08-07 LAB — BASIC METABOLIC PANEL
BUN: 16 mg/dL (ref 6–23)
CO2: 24 mEq/L (ref 19–32)
Calcium: 9.6 mg/dL (ref 8.4–10.5)
Chloride: 100 mEq/L (ref 96–112)
Creatinine, Ser: 0.76 mg/dL (ref 0.40–1.20)
GFR: 83.82 mL/min (ref >60.00)
Glucose, Bld: 76 mg/dL (ref 70–99)
Potassium: 3.9 mEq/L (ref 3.5–5.1)
Sodium: 135 mEq/L (ref 135–145)

## 2019-08-07 LAB — LIPID PANEL
Cholesterol: 233 mg/dL — ABNORMAL HIGH (ref 0–200)
HDL: 57.5 mg/dL (ref >39.00)
LDL Cholesterol: 144 mg/dL — ABNORMAL HIGH (ref 0–99)
NonHDL: 175.51
Total CHOL/HDL Ratio: 4
Triglycerides: 159 mg/dL — ABNORMAL HIGH (ref 0.0–149.0)
VLDL: 31.8 mg/dL (ref 0.0–40.0)

## 2019-08-07 LAB — HEMOGLOBIN A1C: Hgb A1c MFr Bld: 5 % (ref 4.6–6.5)

## 2019-08-08 ENCOUNTER — Encounter: Payer: Self-pay | Admitting: Family Medicine

## 2019-08-25 ENCOUNTER — Encounter: Payer: Self-pay | Admitting: Family Medicine

## 2019-09-25 ENCOUNTER — Other Ambulatory Visit: Payer: Self-pay | Admitting: Family Medicine

## 2019-09-25 MED ORDER — LISDEXAMFETAMINE DIMESYLATE 60 MG PO CAPS: 60.0000 mg | ORAL_CAPSULE | ORAL | 0 refills | Status: DC

## 2019-09-25 NOTE — Telephone Encounter (Signed)
Last office visit 08/06/2019 for CPE.  Last refilled 06/25/2019 for #30 with no refills x 3 months.  No future appointments.

## 2019-10-06 ENCOUNTER — Encounter: Payer: Self-pay | Admitting: Family Medicine

## 2019-10-07 NOTE — Telephone Encounter (Signed)
Lvm and sent mychart message asking pt to call office so I can talk to her.

## 2019-10-13 ENCOUNTER — Other Ambulatory Visit: Payer: Self-pay | Admitting: Family Medicine

## 2019-10-13 NOTE — Telephone Encounter (Signed)
Last office visit 08/06/2019 for CPE.  Last refilled 06/07/2019 for #30 with 3 refills.  No future appointments.

## 2019-10-22 ENCOUNTER — Other Ambulatory Visit: Payer: Self-pay | Admitting: Family Medicine

## 2019-11-03 ENCOUNTER — Encounter: Payer: Self-pay | Admitting: Family Medicine

## 2019-11-04 NOTE — Telephone Encounter (Signed)
[place] [facility] Night - Client Nonclinical Telephone Record  [facility] Client [place] [facility] Night - Client Client Site [place] [facility] - Night Contact Type Call Who Is Calling Patient / Member / Family / Caregiver Caller Name Anysa Tacey Caller Phone Number [phone] Patient Name Beth Long Patient DOB 06-Mar-1981 Call Type Message Only Information Provided Reason for Call Billing Question Initial Comment Caller states that she is needing to leave a message for billing Additional Comment She is calling about bill that she thought had been taken care of but she got another in the mail Disp. Time Disposition Final User 11/03/2019 5:15:52 PM General Information Provided Yes Jackelyn Hoehn Call Closed By: Jackelyn Hoehn Transaction Date/Time: 11/03/2019 5:13:26 PM (ET)

## 2019-11-04 NOTE — Telephone Encounter (Signed)
A msg has been sent to inquire if diagnosis codes can be changed.

## 2019-11-04 NOTE — Telephone Encounter (Signed)
It looks like I used the wrong ICD-10 codes for the blood draw. Can you help?  B12: E53.8  Vit D: E55.9 low vitamin d levels

## 2019-11-05 ENCOUNTER — Encounter: Payer: Self-pay | Admitting: Radiology

## 2019-11-13 NOTE — Telephone Encounter (Signed)
Just checking to see if there has been any further communication with billing or patient.   Please let me know if I need to get involved.   Thanks.

## 2019-11-14 NOTE — Telephone Encounter (Signed)
Per Camelia Eng, She has communicated the following to the patient on 6/9/21t:   , I have spoke to billing, I submitted the new dx codes. They said, not to pay this bill and wait for the next statement. If you receive another bill , please let me know.

## 2019-11-17 ENCOUNTER — Encounter: Payer: Self-pay | Admitting: Family Medicine

## 2019-11-26 ENCOUNTER — Other Ambulatory Visit: Payer: Self-pay | Admitting: Family Medicine

## 2019-11-27 ENCOUNTER — Encounter: Payer: Self-pay | Admitting: *Deleted

## 2019-11-27 DIAGNOSIS — F900 Attention-deficit hyperactivity disorder, predominantly inattentive type: Secondary | ICD-10-CM | POA: Insufficient documentation

## 2019-11-27 HISTORY — DX: Attention-deficit hyperactivity disorder, predominantly inattentive type: F90.0

## 2019-12-05 ENCOUNTER — Encounter: Payer: Self-pay | Admitting: Family Medicine

## 2019-12-31 ENCOUNTER — Other Ambulatory Visit: Payer: Self-pay | Admitting: Family Medicine

## 2019-12-31 NOTE — Telephone Encounter (Signed)
Patient called stating that she requested a refill on her Vyvanse Monday thru [facility]. Advised patient that we did not receive it.  Patient sent a second request.

## 2019-12-31 NOTE — Telephone Encounter (Signed)
Last office visit 08/06/2019 for CPE.  Last refilled 09/25/2019 for #30 with no refills x 3 months.  No future appointments.

## 2020-01-01 MED ORDER — LISDEXAMFETAMINE DIMESYLATE 60 MG PO CAPS: 60.0000 mg | ORAL_CAPSULE | ORAL | 0 refills | Status: DC

## 2020-01-01 NOTE — Telephone Encounter (Signed)
Pt called back requesting information on when her refills will be sent to the pharmacy,,, Pt is concerned she states if she requests for her refills a little early to avoid running out it is denied, if she requests a couple days ahead, she ends up running out of medication because it is not filled on time. She wants to know when she should request for refill to ensure the prescription is filled in a timely manor so she does not run out of medication

## 2020-01-01 NOTE — Telephone Encounter (Signed)
I received her request yesterday at 3 PM  She called into our office at 12:50 PM yesterday.  There is no record of a mychart message in her chart.   Less than a 24 hour turnaround for a controlled substance is exceptionally fast for a doctor's office.

## 2020-01-02 ENCOUNTER — Encounter: Payer: Self-pay | Admitting: Family Medicine

## 2020-01-30 ENCOUNTER — Other Ambulatory Visit: Payer: Self-pay | Admitting: Family Medicine

## 2020-06-07 ENCOUNTER — Telehealth: Payer: BC Managed Care – PPO | Admitting: Family Medicine

## 2020-06-18 ENCOUNTER — Encounter: Payer: Self-pay | Admitting: Family Medicine

## 2020-06-18 ENCOUNTER — Telehealth (INDEPENDENT_AMBULATORY_CARE_PROVIDER_SITE_OTHER): Payer: BC Managed Care – PPO | Admitting: Family Medicine

## 2020-06-18 VITALS — Temp 98.0°F | Ht 66.75 in

## 2020-06-18 DIAGNOSIS — J029 Acute pharyngitis, unspecified: Secondary | ICD-10-CM | POA: Diagnosis not present

## 2020-06-18 MED ORDER — AZITHROMYCIN 250 MG PO TABS: ORAL_TABLET | ORAL | 0 refills | Status: DC

## 2020-06-18 NOTE — Assessment & Plan Note (Addendum)
Negative COVID test, Patient vaccinated. Viral vs bacterial pharyngitis... given severity with no congestion and cough, low grade temp and possible school exposure.. high pretest probability for strep.  Treat empirically with azithromycin. Use ibuprofen 800 mg every eight hours for ST pain.

## 2020-06-18 NOTE — Progress Notes (Signed)
VIRTUAL VISIT Due to national recommendations of social distancing due to COVID 19, a virtual visit is felt to be most appropriate for this patient at this time.   I connected with the patient on 06/18/20 at 11:20 AM EST by virtual telehealth platform and verified that I am speaking with the correct person using two identifiers.   I discussed the limitations, risks, security and privacy concerns of performing an evaluation and management service by  virtual telehealth platform and the availability of in person appointments. I also discussed with the patient that there may be a patient responsible charge related to this service. The patient expressed understanding and agreed to proceed.  Patient location: Home Provider Location: [facility] Participants: Beth Long Beth Long and Beth Long   Chief Complaint  Patient presents with  . Sore Throat    Covid Test Negative-got results today  . Ear pressure    History of Present Illness:  40 year old female patient of Dr. Patsy Lager with history of MDD, ADHA, GAD presents with new onset sore throat and ear pressure.   Date of onset of symptoms 06/12/2020 Started with slight sore throat, fatigue, progressed to worsed severe sore thorat, burning in throat. 06/14/20 started having bilateral ear pressure. Minimal cough and congestion.  Mild swollen glands.  Temperature 100 F few days ago. No N/V.Marland Kitchen off and on diarrhea  No SOB, no wheeze. No face pain.    Goo poi intake.    Tylenol, Advil, Dayquil Nyquil not helping... cannot sleep at night given pain in throat  COVID 19 screen COVID testing: PCR test 06/15/20 negatvie COVID vaccine:05/25/2020 , 08/25/2019 , 07/28/2019 COVID exposure: No recent travel or known exposure to COVID19, No exposure to strep... son had ST.. neg COVID ans negative sterp test.. She works at school.  The importance of social distancing was discussed today.    Review of Systems  Constitutional: Positive for fever and  malaise/fatigue.  HENT: Positive for ear pain and sore throat. Negative for congestion and sinus pain.   Respiratory: Negative for cough and shortness of breath.   Cardiovascular: Negative for chest pain.      Past Medical History:  Diagnosis Date  . Anemia   . Anxiety   . History of nephrolithiasis     reports that she has quit smoking. She has never used smokeless tobacco. She reports current alcohol use. She reports that she does not use drugs.   Current Outpatient Medications:  .  busPIRone (BUSPAR) 15 MG tablet, Take 15 mg by mouth 3 (three) times daily., Disp: , Rfl:  .  citalopram (CELEXA) 20 MG tablet, Take 20 mg by mouth daily., Disp: , Rfl:  .  JUNEL 1.5/30 1.5-30 MG-MCG tablet, TAKE 1 ACTIVE TABLET DAILY. TAKE IN A CONTINUOUS FASHION, Disp: , Rfl: 4 .  lisdexamfetamine (VYVANSE) 60 MG capsule, Take 1 capsule (60 mg total) by mouth every morning., Disp: 30 capsule, Rfl: 0 .  lisdexamfetamine (VYVANSE) 60 MG capsule, Take 1 capsule (60 mg total) by mouth every morning., Disp: 30 capsule, Rfl: 0 .  lisdexamfetamine (VYVANSE) 60 MG capsule, Take 1 capsule (60 mg total) by mouth every morning., Disp: 30 capsule, Rfl: 0 .  LORazepam (ATIVAN) 1 MG tablet, TAKE 1 TABLET BY MOUTH 3 TIMES A DAY AS NEEDED FOR ANXIETY, Disp: 30 tablet, Rfl: 3 .  methylphenidate (RITALIN) 5 MG tablet, Take 5 mg by mouth daily., Disp: , Rfl:  .  traZODone (DESYREL) 100 MG tablet, Take 100-150 mg by mouth  at bedtime as needed., Disp: , Rfl:  .  valACYclovir (VALTREX) 1000 MG tablet, Take 2 tablets (2,000 mg total) by mouth 2 (two) times daily. As directed, Disp: 20 tablet, Rfl: 2   Observations/Objective: Temperature 98 F (36.7 C), temperature source Oral, height 5' 6.75" (1.695 m).  Physical Exam  Physical Exam Constitutional:      General: The patient is not in acute distress. Pulmonary:     Effort: Pulmonary effort is normal. No respiratory distress.  Neurological:     Mental Status: The patient  is alert and oriented to person, place, and time.  Psychiatric:        Mood and Affect: Mood normal.        Behavior: Behavior normal.  Assessment and Plan    I discussed the assessment and treatment plan with the patient. The patient was provided an opportunity to ask questions and all were answered. The patient agreed with the plan and demonstrated an understanding of the instructions.   The patient was advised to call back or seek an in-person evaluation if the symptoms worsen or if the condition fails to improve as anticipated.     Kerby Nora, MD

## 2020-06-18 NOTE — Patient Instructions (Signed)
Start antibiotics for presumed strep throat.  Use ibuprofen 800 mg three times daily for pain.  Return to school 24 hours after antibiotics.

## 2020-06-18 NOTE — Progress Notes (Signed)
Work note written as instructed by Dr. Ermalene Searing and sent to [employer].

## 2020-08-06 ENCOUNTER — Ambulatory Visit: Payer: BC Managed Care – PPO | Admitting: Primary Care

## 2020-08-06 ENCOUNTER — Encounter: Payer: Self-pay | Admitting: Primary Care

## 2020-08-06 ENCOUNTER — Other Ambulatory Visit: Payer: Self-pay

## 2020-08-06 VITALS — BP 108/62 | HR 100 | Temp 97.5°F | Ht 66.75 in | Wt 138.0 lb

## 2020-08-06 DIAGNOSIS — R5383 Other fatigue: Secondary | ICD-10-CM | POA: Insufficient documentation

## 2020-08-06 LAB — COMPREHENSIVE METABOLIC PANEL
ALT: 16 U/L (ref 0–35)
AST: 18 U/L (ref 0–37)
Albumin: 3.9 g/dL (ref 3.5–5.2)
Alkaline Phosphatase: 49 U/L (ref 39–117)
BUN: 17 mg/dL (ref 6–23)
CO2: 27 mEq/L (ref 19–32)
Calcium: 9.4 mg/dL (ref 8.4–10.5)
Chloride: 101 mEq/L (ref 96–112)
Creatinine, Ser: 0.78 mg/dL (ref 0.40–1.20)
GFR: 93.89 mL/min (ref >60.00)
Glucose, Bld: 135 mg/dL — ABNORMAL HIGH (ref 70–99)
Potassium: 4.7 mEq/L (ref 3.5–5.1)
Sodium: 135 mEq/L (ref 135–145)
Total Bilirubin: 0.3 mg/dL (ref 0.2–1.2)
Total Protein: 7 g/dL (ref 6.0–8.3)

## 2020-08-06 LAB — VITAMIN D 25 HYDROXY (VIT D DEFICIENCY, FRACTURES): VITD: 70.77 ng/mL (ref 30.00–100.00)

## 2020-08-06 LAB — CBC
HCT: 40.6 % (ref 36.0–46.0)
Hemoglobin: 13.9 g/dL (ref 12.0–15.0)
MCHC: 34.2 g/dL (ref 30.0–36.0)
MCV: 93.6 fl (ref 78.0–100.0)
Platelets: 297 10*3/uL (ref 150.0–400.0)
RBC: 4.33 Mil/uL (ref 3.87–5.11)
RDW: 13.7 % (ref 11.5–15.5)
WBC: 5.9 10*3/uL (ref 4.0–10.5)

## 2020-08-06 LAB — VITAMIN B12: Vitamin B-12: 619 pg/mL (ref 211–911)

## 2020-08-06 LAB — T4, FREE: Free T4: 1.93 ng/dL — ABNORMAL HIGH (ref 0.60–1.60)

## 2020-08-06 LAB — MONONUCLEOSIS SCREEN: Mono Screen: NEGATIVE

## 2020-08-06 LAB — TSH: TSH: 1.4 u[IU]/mL (ref 0.35–4.50)

## 2020-08-06 NOTE — Patient Instructions (Signed)
Stop by the lab prior to leaving today. I will notify you of your results once received.   It was a pleasure meeting you!  

## 2020-08-06 NOTE — Assessment & Plan Note (Addendum)
Chronic for four months, no significant changes. Doesn't really meet the clinical picture for sleep apnea, although will keep on differential list. No breakthrough vaginal/heavy bleeding. Anxiety appears to be well managed.  Checking labs today including TSH, Free T4, CBC, vitamin  B12, vitamin D, CMP.  If no obvious cause then recommended that she reach out to PCP for thoughts.

## 2020-08-06 NOTE — Progress Notes (Signed)
Subjective:    Patient ID: Beth Long, female    DOB: [birth year], 40 y.o.   MRN: [id number]  HPI  Margo Lama is a very pleasant 40 y.o. female patient of Dr. Patsy Lager with a history of GAD, Depression, ADHD, migraines who presents today with a chief complaint of fatigue.  Following with psychiatry, managed on citalopram 20 mg, Vyvanse 60 mg, methylphenidate 5 mg, trazodone 100-150 mg, Buspar 15 mg TID, lorazepam 1 mg TID PRN. Overall she feels that her anxiety and depression is well managed.   She's feeling tired which occurs around 12pm-2 pm daily. Initially she thoughts her symptoms were secondary to stress, but now she doesn't feel that she's able to "function". She's also noticed a "fever" of 99.2 to 99.8, "massive hair loss".  Her brother is an oncologist so she called him who advised she become evaluated. Her husband doesn't believe that she's sleeping very well, she disagrees. Her husband mentions that she snores and wakes herself up during the night. She feels that she's sleeping much better since initiated on Trazodone per psychiatry. She wakes up in the morning feeling slightly rested. She goes to bed around 9 pm-10pm, wakes around 6:30am.   She is no longer taking vitamin B12, had been taking historically. She denies breakthrough vaginal bleeding. She does mention a family history of "thyroid stuff". She is taking vitamin D and Biotin, was on B12 but hasn't taken.  Symptoms began about four months ago.  Review of Systems  Constitutional: Positive for fatigue. Negative for unexpected weight change.  Cardiovascular: Negative for palpitations.  Gastrointestinal: Negative for blood in stool.  Genitourinary: Negative for menstrual problem.  Psychiatric/Behavioral: The patient is not nervous/anxious.          Past Medical History:  Diagnosis Date  . Anemia   . Anxiety   . History of nephrolithiasis     Social History   Socioeconomic History  . Marital status:  Married    Spouse name: Not on file  . Number of children: Not on file  . Years of education: Not on file  . Highest education level: Not on file  Occupational History  . Occupation: school Scientist, forensic: [employer]  Tobacco Use  . Smoking status: Former Games developer  . Smokeless tobacco: Never Used  Substance and Sexual Activity  . Alcohol use: Yes  . Drug use: No  . Sexual activity: Not on file  Other Topics Concern  . Not on file  Social History Narrative   Regular exercise-yes   Social Determinants of Health   Financial Resource Strain: Not on file  Food Insecurity: Not on file  Transportation Needs: Not on file  Physical Activity: Not on file  Stress: Not on file  Social Connections: Not on file  Intimate Partner Violence: Not on file    Past Surgical History:  Procedure Laterality Date  . BREAST REDUCTION SURGERY      No family history on file.  Allergies  Allergen Reactions  . Amoxicillin     REACTION: rash from head to toe    Current Outpatient Medications on File Prior to Visit  Medication Sig Dispense Refill  . azithromycin (ZITHROMAX) 250 MG tablet 2 tab po x 1 day then 1 tab po daily 6 tablet 0  . busPIRone (BUSPAR) 15 MG tablet Take 15 mg by mouth 3 (three) times daily.    . citalopram (CELEXA) 20 MG tablet Take 20 mg by mouth daily.    Marland Kitchen  JUNEL 1.5/30 1.5-30 MG-MCG tablet TAKE 1 ACTIVE TABLET DAILY. TAKE IN A CONTINUOUS FASHION  4  . lisdexamfetamine (VYVANSE) 60 MG capsule Take 1 capsule (60 mg total) by mouth every morning. 30 capsule 0  . lisdexamfetamine (VYVANSE) 60 MG capsule Take 1 capsule (60 mg total) by mouth every morning. 30 capsule 0  . lisdexamfetamine (VYVANSE) 60 MG capsule Take 1 capsule (60 mg total) by mouth every morning. 30 capsule 0  . LORazepam (ATIVAN) 1 MG tablet TAKE 1 TABLET BY MOUTH 3 TIMES A DAY AS NEEDED FOR ANXIETY 30 tablet 3  . methylphenidate (RITALIN) 5 MG tablet Take 5 mg by mouth daily.    .  traZODone (DESYREL) 100 MG tablet Take 100-150 mg by mouth at bedtime as needed.    . valACYclovir (VALTREX) 1000 MG tablet Take 2 tablets (2,000 mg total) by mouth 2 (two) times daily. As directed 20 tablet 2   No current facility-administered medications on file prior to visit.    BP 108/62   Pulse 100   Temp (!) 97.5 F (36.4 C) (Temporal)   Ht 5' 6.75" (1.695 m)   Wt 138 lb (62.6 kg)   SpO2 98%   BMI 21.78 kg/m  Objective:   Physical Exam Cardiovascular:     Rate and Rhythm: Normal rate and regular rhythm.  Pulmonary:     Effort: Pulmonary effort is normal.     Breath sounds: Normal breath sounds.  Musculoskeletal:     Cervical back: Neck supple.  Skin:    General: Skin is warm and dry.           Assessment & Plan:      This visit occurred during the SARS-CoV-2 public health emergency.  Safety protocols were in place, including screening questions prior to the visit, additional usage of staff PPE, and extensive cleaning of exam room while observing appropriate contact time as indicated for disinfecting solutions.

## 2020-08-07 DIAGNOSIS — R5383 Other fatigue: Secondary | ICD-10-CM

## 2020-08-07 DIAGNOSIS — L659 Nonscarring hair loss, unspecified: Secondary | ICD-10-CM

## 2020-08-07 DIAGNOSIS — R7989 Other specified abnormal findings of blood chemistry: Secondary | ICD-10-CM

## 2022-01-26 ENCOUNTER — Ambulatory Visit (INDEPENDENT_AMBULATORY_CARE_PROVIDER_SITE_OTHER): Payer: 59 | Admitting: Podiatry

## 2022-01-26 DIAGNOSIS — B070 Plantar wart: Secondary | ICD-10-CM | POA: Diagnosis not present

## 2022-02-01 NOTE — Progress Notes (Signed)
  Subjective:  Patient ID: Beth Long, female    DOB: 02-May-1981,  MRN: [id number]  Chief Complaint  Patient presents with   Plantar Warts    Right foot possible wart     41 y.o. female presents with the above complaint.  Patient presents with right heel plantar verruca.  Patient states is painful to touch.  She states been over several months is progressive gotten worse.  She went to get it evaluated she has not seen anyone else prior to seeing me.  She denies any other acute complaints.  Pain scale is very mild.  She has tried some over-the-counter medications none of which has helped.   Review of Systems: Negative except as noted in the HPI. Denies N/V/F/Ch.  Past Medical History:  Diagnosis Date   Anemia    Anxiety    History of nephrolithiasis     Current Outpatient Medications:    busPIRone (BUSPAR) 15 MG tablet, Take 15 mg by mouth 3 (three) times daily., Disp: , Rfl:    citalopram (CELEXA) 20 MG tablet, Take 20 mg by mouth daily., Disp: , Rfl:    JUNEL 1.5/30 1.5-30 MG-MCG tablet, TAKE 1 ACTIVE TABLET DAILY. TAKE IN A CONTINUOUS FASHION, Disp: , Rfl: 4   lisdexamfetamine (VYVANSE) 60 MG capsule, Take 1 capsule (60 mg total) by mouth every morning., Disp: 30 capsule, Rfl: 0   LORazepam (ATIVAN) 1 MG tablet, TAKE 1 TABLET BY MOUTH 3 TIMES A DAY AS NEEDED FOR ANXIETY, Disp: 30 tablet, Rfl: 3   methylphenidate (RITALIN) 5 MG tablet, Take 5 mg by mouth daily., Disp: , Rfl:    traZODone (DESYREL) 100 MG tablet, Take 100-150 mg by mouth at bedtime as needed., Disp: , Rfl:    valACYclovir (VALTREX) 1000 MG tablet, Take 2 tablets (2,000 mg total) by mouth 2 (two) times daily. As directed, Disp: 20 tablet, Rfl: 2  Social History   Tobacco Use  Smoking Status Former  Smokeless Tobacco Never    Allergies  Allergen Reactions   Amoxicillin     REACTION: rash from head to toe   Objective:  There were no vitals filed for this visit. There is no height or weight on file to  calculate BMI. Constitutional Well developed. Well nourished.  Vascular Dorsalis pedis pulses palpable bilaterally. Posterior tibial pulses palpable bilaterally. Capillary refill normal to all digits.  No cyanosis or clubbing noted. Pedal hair growth normal.  Neurologic Normal speech. Oriented to person, place, and time. Epicritic sensation to light touch grossly present bilaterally.  Dermatologic Hyperkeratotic lesion with pinpoint bleeding noted to the right heel.  This is upon debridement.  No central nucleated core noted  Orthopedic: Normal joint ROM without pain or crepitus bilaterally. No visible deformities. No bony tenderness.   Radiographs: None Assessment:   1. Plantar verruca    Plan:  Patient was evaluated and treated and all questions answered.  Right heel porokeratosis -All questions and concerns were discussed with the patient in extensive detail. --Lesion was debrided today without complications. Hemostasis was achieved and the area was cleaned. Cantharone was applied followed by an occlusive bandage. Post procedure complications were discussed. Monitor for signs or symptoms of infection and directed to call the office mainly should any occur.   No follow-ups on file.

## 2022-02-09 ENCOUNTER — Ambulatory Visit (INDEPENDENT_AMBULATORY_CARE_PROVIDER_SITE_OTHER): Payer: 59 | Admitting: Podiatry

## 2022-02-09 DIAGNOSIS — B070 Plantar wart: Secondary | ICD-10-CM | POA: Diagnosis not present

## 2022-02-21 NOTE — Progress Notes (Signed)
  Subjective:  Patient ID: Beth Long, female    DOB: [birth year],  MRN: [id number]  Chief Complaint  Patient presents with   Plantar Warts   [name] for follow-up to right plantar verruca.  She states she is doing a lot better.  She states she was not able to tolerate the Cantharone therapy did cause her some blister.  Overall she is doing much better now denies any other acute complaints.   Review of Systems: Negative except as noted in the HPI. Denies N/V/F/Ch.  Past Medical History:  Diagnosis Date   Anemia    Anxiety    History of nephrolithiasis     Current Outpatient Medications:    busPIRone (BUSPAR) 15 MG tablet, Take 15 mg by mouth 3 (three) times daily., Disp: , Rfl:    citalopram (CELEXA) 20 MG tablet, Take 20 mg by mouth daily., Disp: , Rfl:    JUNEL 1.5/30 1.5-30 MG-MCG tablet, TAKE 1 ACTIVE TABLET DAILY. TAKE IN A CONTINUOUS FASHION, Disp: , Rfl: 4   lisdexamfetamine (VYVANSE) 60 MG capsule, Take 1 capsule (60 mg total) by mouth every morning., Disp: 30 capsule, Rfl: 0   LORazepam (ATIVAN) 1 MG tablet, TAKE 1 TABLET BY MOUTH 3 TIMES A DAY AS NEEDED FOR ANXIETY, Disp: 30 tablet, Rfl: 3   methylphenidate (RITALIN) 5 MG tablet, Take 5 mg by mouth daily., Disp: , Rfl:    traZODone (DESYREL) 100 MG tablet, Take 100-150 mg by mouth at bedtime as needed., Disp: , Rfl:    valACYclovir (VALTREX) 1000 MG tablet, Take 2 tablets (2,000 mg total) by mouth 2 (two) times daily. As directed, Disp: 20 tablet, Rfl: 2  Social History   Tobacco Use  Smoking Status Former  Smokeless Tobacco Never    Allergies  Allergen Reactions   Amoxicillin     REACTION: rash from head to toe   Objective:  There were no vitals filed for this visit. There is no height or weight on file to calculate BMI. Constitutional Well developed. Well nourished.  Vascular Dorsalis pedis pulses palpable bilaterally. Posterior tibial pulses palpable bilaterally. Capillary refill normal to all  digits.  No cyanosis or clubbing noted. Pedal hair growth normal.  Neurologic Normal speech. Oriented to person, place, and time. Epicritic sensation to light touch grossly present bilaterally.  Dermatologic No other plantar verruca noted.  No pinpoint bleeding noted.  Orthopedic: Normal joint ROM without pain or crepitus bilaterally. No visible deformities. No bony tenderness.   Radiographs: None Assessment:   No diagnosis found.  Plan:  Patient was evaluated and treated and all questions answered.  Right heel porokeratosis -All questions and concerns were discussed with the patient in extensive detail. --Niccoli healed.  No further signs of plantar verruca noted.  At this time I discussed prevention technique and shoe gear modification if any foot and ankle issues arise in the future of asked her to come back and see me.  She states understanding.   No follow-ups on file.

## 2022-06-12 LAB — HM PAP SMEAR

## 2022-06-12 LAB — RESULTS CONSOLE HPV: CHL HPV: NEGATIVE

## 2022-06-28 ENCOUNTER — Other Ambulatory Visit: Payer: Self-pay | Admitting: Family Medicine

## 2022-06-28 DIAGNOSIS — Z1159 Encounter for screening for other viral diseases: Secondary | ICD-10-CM

## 2022-06-28 DIAGNOSIS — Z13220 Encounter for screening for lipoid disorders: Secondary | ICD-10-CM

## 2022-06-28 DIAGNOSIS — E559 Vitamin D deficiency, unspecified: Secondary | ICD-10-CM

## 2022-06-28 DIAGNOSIS — R5383 Other fatigue: Secondary | ICD-10-CM

## 2022-06-28 DIAGNOSIS — Z131 Encounter for screening for diabetes mellitus: Secondary | ICD-10-CM

## 2022-06-28 DIAGNOSIS — Z79899 Other long term (current) drug therapy: Secondary | ICD-10-CM

## 2022-06-28 LAB — HM MAMMOGRAPHY

## 2022-06-30 ENCOUNTER — Other Ambulatory Visit (INDEPENDENT_AMBULATORY_CARE_PROVIDER_SITE_OTHER): Payer: 59

## 2022-06-30 DIAGNOSIS — Z131 Encounter for screening for diabetes mellitus: Secondary | ICD-10-CM | POA: Diagnosis not present

## 2022-06-30 DIAGNOSIS — Z13220 Encounter for screening for lipoid disorders: Secondary | ICD-10-CM

## 2022-06-30 DIAGNOSIS — Z79899 Other long term (current) drug therapy: Secondary | ICD-10-CM | POA: Diagnosis not present

## 2022-06-30 DIAGNOSIS — Z1159 Encounter for screening for other viral diseases: Secondary | ICD-10-CM

## 2022-06-30 DIAGNOSIS — R5383 Other fatigue: Secondary | ICD-10-CM | POA: Diagnosis not present

## 2022-06-30 DIAGNOSIS — E559 Vitamin D deficiency, unspecified: Secondary | ICD-10-CM | POA: Diagnosis not present

## 2022-06-30 LAB — HEPATIC FUNCTION PANEL
ALT: 15 U/L (ref 0–35)
AST: 15 U/L (ref 0–37)
Albumin: 4.3 g/dL (ref 3.5–5.2)
Alkaline Phosphatase: 50 U/L (ref 39–117)
Bilirubin, Direct: 0.1 mg/dL (ref 0.0–0.3)
Total Bilirubin: 0.4 mg/dL (ref 0.2–1.2)
Total Protein: 7.6 g/dL (ref 6.0–8.3)

## 2022-06-30 LAB — CBC WITH DIFFERENTIAL/PLATELET
Basophils Absolute: 0 10*3/uL (ref 0.0–0.1)
Basophils Relative: 0.6 % (ref 0.0–3.0)
Eosinophils Absolute: 0.1 10*3/uL (ref 0.0–0.7)
Eosinophils Relative: 1.8 % (ref 0.0–5.0)
HCT: 42.3 % (ref 36.0–46.0)
Hemoglobin: 14.6 g/dL (ref 12.0–15.0)
Lymphocytes Relative: 34.1 % (ref 12.0–46.0)
Lymphs Abs: 2.5 10*3/uL (ref 0.7–4.0)
MCHC: 34.6 g/dL (ref 30.0–36.0)
MCV: 93.5 fl (ref 78.0–100.0)
Monocytes Absolute: 0.5 10*3/uL (ref 0.1–1.0)
Monocytes Relative: 6.5 % (ref 3.0–12.0)
Neutro Abs: 4.2 10*3/uL (ref 1.4–7.7)
Neutrophils Relative %: 57 % (ref 43.0–77.0)
Platelets: 372 10*3/uL (ref 150.0–400.0)
RBC: 4.53 Mil/uL (ref 3.87–5.11)
RDW: 13.4 % (ref 11.5–15.5)
WBC: 7.5 10*3/uL (ref 4.0–10.5)

## 2022-06-30 LAB — BASIC METABOLIC PANEL
BUN: 23 mg/dL (ref 6–23)
CO2: 26 mEq/L (ref 19–32)
Calcium: 10 mg/dL (ref 8.4–10.5)
Chloride: 101 mEq/L (ref 96–112)
Creatinine, Ser: 0.91 mg/dL (ref 0.40–1.20)
GFR: 77 mL/min (ref >60.00)
Glucose, Bld: 93 mg/dL (ref 70–99)
Potassium: 4.4 mEq/L (ref 3.5–5.1)
Sodium: 137 mEq/L (ref 135–145)

## 2022-06-30 LAB — LIPID PANEL
Cholesterol: 222 mg/dL — ABNORMAL HIGH (ref 0–200)
HDL: 65 mg/dL (ref >39.00)
LDL Cholesterol: 123 mg/dL — ABNORMAL HIGH (ref 0–99)
NonHDL: 157.03
Total CHOL/HDL Ratio: 3
Triglycerides: 169 mg/dL — ABNORMAL HIGH (ref 0.0–149.0)
VLDL: 33.8 mg/dL (ref 0.0–40.0)

## 2022-06-30 LAB — HEMOGLOBIN A1C: Hgb A1c MFr Bld: 5.2 % (ref 4.6–6.5)

## 2022-06-30 LAB — TSH: TSH: 2.54 u[IU]/mL (ref 0.35–5.50)

## 2022-06-30 LAB — VITAMIN D 25 HYDROXY (VIT D DEFICIENCY, FRACTURES): VITD: 41.05 ng/mL (ref 30.00–100.00)

## 2022-07-02 LAB — HEPATITIS C ANTIBODY: Hepatitis C Ab: NONREACTIVE

## 2022-07-06 ENCOUNTER — Encounter: Payer: BC Managed Care – PPO | Admitting: Family Medicine

## 2022-07-12 ENCOUNTER — Ambulatory Visit (INDEPENDENT_AMBULATORY_CARE_PROVIDER_SITE_OTHER): Payer: 59 | Admitting: Family Medicine

## 2022-07-12 VITALS — BP 90/70 | HR 96 | Temp 98.3°F | Ht 67.0 in | Wt 145.2 lb

## 2022-07-12 DIAGNOSIS — Z0000 Encounter for general adult medical examination without abnormal findings: Secondary | ICD-10-CM | POA: Diagnosis not present

## 2022-07-12 NOTE — Progress Notes (Unsigned)
Ayub Kirsh T. Kilyn Maragh, MD, [place] [facility] [place], [address]  Phone: [phone]  FAX: [phone]  Beth Long - 42 y.o. female  MRN [id number]  Date of Birth: 1980-06-26  Date: 07/12/2022  PCP: Owens Loffler, MD  Referral: Owens Loffler, MD  Chief Complaint  Patient presents with   Annual Exam   Patient Care Team: Owens Loffler, MD as PCP - General Subjective:   Beth Long is a 42 y.o. pleasant patient who presents with the following:  Health Maintenance Summary Reviewed and updated, unless pt declines services.  Tobacco History Reviewed. Non-smoker Alcohol: No concerns, no excessive use Exercise Habits: Some activity, rec at least 30 mins 5 times a week. 4 times a week on treadmill or bike.  Walks with dogs, too.  STD concerns: none Drug Use: None Lumps or breast concerns: no  Covid booster GYN does paps - none needed.   Everything is going well.   The 10-year ASCVD risk score (Arnett DK, et al., 2019) is: 0.3%   Values used to calculate the score:     Age: 2 years     Sex: Female     Is Non-Hispanic African American: No     Diabetic: No     Tobacco smoker: No     Systolic Blood Pressure: 90 mmHg     Is BP treated: No     HDL Cholesterol: 65 mg/dL     Total Cholesterol: 222 mg/dL   Health Maintenance  Topic Date Due   COVID-19 Vaccine (4 - 2023-24 season) 01/27/2022   PAP SMEAR-Modifier  06/12/2025   DTaP/Tdap/Td (2 - Td or Tdap) 01/18/2027   INFLUENZA VACCINE  Completed   Hepatitis C Screening  Completed   HIV Screening  Completed   HPV VACCINES  Aged Out    Immunization History  Administered Date(s) Administered   Influenza, Seasonal, Injecte, Preservative Fre 02/15/2010   Influenza,inj,Quad PF,6+ Mos 07/06/2014, 03/28/2017, 02/28/2018, 06/14/2022   Influenza-Unspecified 03/30/2015, 02/27/2019   PFIZER(Purple Top)SARS-COV-2 Vaccination 07/28/2019,  08/25/2019, 05/25/2020   PPD Test 01/17/2017   Tdap 01/17/2017   Patient Active Problem List   Diagnosis Date Noted   ADHD (attention deficit hyperactivity disorder), inattentive type 11/27/2019   COMMON MIGRAINE 08/08/2010   Major depression in complete remission ([place]) 04/04/2010   GAD (generalized anxiety disorder) 12/29/2008   NEPHROLITHIASIS, HX OF 12/29/2008    Past Medical History:  Diagnosis Date   Anemia    Anxiety    History of nephrolithiasis     Past Surgical History:  Procedure Laterality Date   BREAST REDUCTION SURGERY      No family history on file.  Social History   Social History Narrative   Regular exercise-yes    Past Medical History, Surgical History, Social History, Family History, Problem List, Medications, and Allergies have been reviewed and updated if relevant.  Review of Systems: Pertinent positives are listed above.  Otherwise, a full 14 point review of systems has been done in full and it is negative except where it is noted positive.  Objective:   BP 90/70   Pulse 96   Temp 98.3 F (36.8 C) (Temporal)   Ht 5' 7"$  (1.702 m)   Wt 145 lb 4 oz (65.9 kg)   SpO2 97%   BMI 22.75 kg/m  Ideal Body Weight: Weight in (lb) to have BMI = 25: 159.3 No results found.    07/12/2022   11:07 AM 08/06/2019  4:23 PM 11/14/2017   10:32 AM  Depression screen PHQ 2/9  Decreased Interest 0 1 0  Down, Depressed, Hopeless 0 0 0  PHQ - 2 Score 0 1 0  Altered sleeping 1 2   Tired, decreased energy 1 2   Change in appetite 0 0   Feeling bad or failure about yourself  0 0   Trouble concentrating 0 1   Moving slowly or fidgety/restless 0 0   Suicidal thoughts 0 0   PHQ-9 Score 2 6   Difficult doing work/chores Not difficult at all Somewhat difficult      GEN: well developed, well nourished, no acute distress Eyes: conjunctiva and lids normal, PERRLA, EOMI ENT: TM clear, nares clear, oral exam WNL Neck: supple, no lymphadenopathy, no thyromegaly, no  JVD Pulm: clear to auscultation and percussion, respiratory effort normal CV: regular rate and rhythm, S1-S2, no murmur, rub or gallop, no bruits Chest: no scars, masses, no lumps BREAST: breast exam declined GI: soft, non-tender; no hepatosplenomegaly, masses; active bowel sounds all quadrants GU: GU exam declined Lymph: no cervical, axillary or inguinal adenopathy MSK: gait normal, muscle tone and strength WNL, no joint swelling, effusions, discoloration, crepitus  SKIN: clear, good turgor, color WNL, no rashes, lesions, or ulcerations Neuro: normal mental status, normal strength, sensation, and motion Psych: alert; oriented to person, place and time, normally interactive and not anxious or depressed in appearance.   All labs reviewed with patient. Results for orders placed or performed in visit on 07/12/22  HM MAMMOGRAPHY  Result Value Ref Range   HM Mammogram 0-4 Bi-Rad 0-4 Bi-Rad, Self Reported Normal  HM PAP SMEAR  Result Value Ref Range   HM Pap smear Unsatisfactory   Results Console HPV  Result Value Ref Range   CHL HPV Negative    No results found.  Assessment and Plan:     ICD-10-CM   1. Healthcare maintenance  Z00.00       Health Maintenance Exam: The patient's preventative maintenance and recommended screening tests for an annual wellness exam were reviewed in full today. Brought up to date unless services declined.  Counselled on the importance of diet, exercise, and its role in overall health and mortality. The patient's FH and SH was reviewed, including their home life, tobacco status, and drug and alcohol status.  Follow-up in 1 year for physical exam or additional follow-up below.  Disposition: No follow-ups on file.  No future appointments.   No orders of the defined types were placed in this encounter.  Medications Discontinued During This Encounter  Medication Reason   LORazepam (ATIVAN) 1 MG tablet Dose change   lisdexamfetamine (VYVANSE) 60  MG capsule Dose change   methylphenidate (RITALIN) 5 MG tablet Dose change   Orders Placed This Encounter  Procedures   HM MAMMOGRAPHY   HM PAP SMEAR    Signed,  Kirstine Jacquin T. Mackenzy Eisenberg, MD   Allergies as of 07/12/2022       Reactions   Amoxicillin    REACTION: rash from head to toe        Medication List        Accurate as of July 12, 2022 11:20 AM. If you have any questions, ask your nurse or doctor.          STOP taking these medications    lisdexamfetamine 60 MG capsule Commonly known as: Vyvanse       TAKE these medications    Adderall XR 30 MG 24 hr capsule Generic  drug: amphetamine-dextroamphetamine Take 30 mg by mouth every morning.   busPIRone 15 MG tablet Commonly known as: BUSPAR Take 15 mg by mouth 3 (three) times daily.   citalopram 20 MG tablet Commonly known as: CELEXA Take 20 mg by mouth daily.   Junel 1.5/30 1.5-30 MG-MCG tablet Generic drug: Norethindrone Acetate-Ethinyl Estradiol TAKE 1 ACTIVE TABLET DAILY. TAKE IN A CONTINUOUS FASHION   LORazepam 0.5 MG tablet Commonly known as: ATIVAN Take 0.5 mg by mouth daily as needed for anxiety. What changed: Another medication with the same name was removed. Continue taking this medication, and follow the directions you see here.   methylphenidate 20 MG tablet Commonly known as: RITALIN Take 20 mg by mouth daily. What changed: Another medication with the same name was removed. Continue taking this medication, and follow the directions you see here.   spironolactone 100 MG tablet Commonly known as: ALDACTONE Take 100 mg by mouth daily.   SUMAtriptan 25 MG tablet Commonly known as: IMITREX Take 25 mg by mouth daily as needed.   traZODone 100 MG tablet Commonly known as: DESYREL Take 100-150 mg by mouth at bedtime as needed.   valACYclovir 1000 MG tablet Commonly known as: VALTREX Take 2 tablets (2,000 mg total) by mouth 2 (two) times daily. As directed

## 2022-07-13 ENCOUNTER — Encounter: Payer: Self-pay | Admitting: Family Medicine

## 2023-01-28 HISTORY — PX: CERVICAL ABLATION: SHX5771

## 2023-09-21 DIAGNOSIS — L648 Other androgenic alopecia: Secondary | ICD-10-CM | POA: Diagnosis not present

## 2023-09-21 DIAGNOSIS — B009 Herpesviral infection, unspecified: Secondary | ICD-10-CM | POA: Diagnosis not present

## 2023-09-25 ENCOUNTER — Encounter: Payer: Self-pay | Admitting: Family Medicine

## 2023-09-25 NOTE — Progress Notes (Unsigned)
 Jashawn Floyd T. Shannell Mikkelsen, MD, CAQ Sports Medicine [facility] at [facility] [address] [place], [address]  Phone: [phone]  FAX: [phone]  Beth Long - 43 y.o. female  MRN [id number]  Date of Birth: [birth year]  Date: 09/26/2023  PCP: Scherrie Curt, MD  Referral: Scherrie Curt, MD  No chief complaint on file.  Patient Care Team: Scherrie Curt, MD as PCP - General Subjective:   Beth Long is a 43 y.o. pleasant patient who presents with the following:  Health Maintenance Summary Reviewed and updated, unless pt declines services.  Tobacco History Reviewed. Non-smoker Alcohol: No concerns, no excessive use Exercise Habits: Some activity, rec at least 30 mins 5 times a week STD concerns: none Drug Use: None Lumps or breast concerns: no  Beth Long is a well-known patient for many years.  She is globally very healthy.  She does have significant anxiety, depression and ADHD. She is currently on Adderall XR 30 mg along with Ritalin 20 mg IR. Buspirone 15 mg p.o. 3 times daily Celexa 20 mg a day Ativan  as needed Trazodone 100 mg at sleep.  Colonoscopy COVID booster    Health Maintenance  Topic Date Due   COVID-19 Vaccine (4 - 2024-25 season) 01/28/2023   Colonoscopy  Never done   INFLUENZA VACCINE  12/28/2023   DTaP/Tdap/Td (2 - Td or Tdap) 01/18/2027   Cervical Cancer Screening (HPV/Pap Cotest)  06/15/2027   Hepatitis C Screening  Completed   HIV Screening  Completed   HPV VACCINES  Aged Out   Meningococcal B Vaccine  Aged Out    Immunization History  Administered Date(s) Administered   Influenza, Seasonal, Injecte, Preservative Fre 02/15/2010   Influenza,inj,Quad PF,6+ Mos 07/06/2014, 03/28/2017, 02/28/2018, 06/14/2022   Influenza-Unspecified 03/30/2015, 02/27/2019   PFIZER(Purple Top)SARS-COV-2 Vaccination 07/28/2019, 08/25/2019, 05/25/2020   PPD Test 01/17/2017   Tdap 01/17/2017   Patient Active Problem  List   Diagnosis Date Noted   ADHD (attention deficit hyperactivity disorder), inattentive type 11/27/2019    Priority: Medium    Migraine without aura 08/08/2010    Priority: Medium    Major depression in complete remission (HCC) 04/04/2010    Priority: Medium    GAD (generalized anxiety disorder) 12/29/2008    Priority: Medium    NEPHROLITHIASIS, HX OF 12/29/2008    Past Medical History:  Diagnosis Date   ADHD (attention deficit hyperactivity disorder), inattentive type 11/27/2019   GAD (generalized anxiety disorder) 12/29/2008   History of nephrolithiasis    Major depression in complete remission (HCC) 04/04/2010   Migraine without aura 08/08/2010    Past Surgical History:  Procedure Laterality Date   BREAST REDUCTION SURGERY      No family history on file.  Social History   Social History Narrative   Regular exercise-yes    Past Medical History, Surgical History, Social History, Family History, Problem List, Medications, and Allergies have been reviewed and updated if relevant.  Review of Systems: Pertinent positives are listed above.  Otherwise, a full 14 point review of systems has been done in full and it is negative except where it is noted positive.  Objective:   There were no vitals taken for this visit. Ideal Body Weight:   No results found.    07/12/2022   11:07 AM 08/06/2019    4:23 PM 11/14/2017   10:32 AM  Depression screen PHQ 2/9  Decreased Interest 0 1 0  Down, Depressed, Hopeless 0 0 0  PHQ - 2 Score 0  1 0  Altered sleeping 1 2   Tired, decreased energy 1 2   Change in appetite 0 0   Feeling bad or failure about yourself  0 0   Trouble concentrating 0 1   Moving slowly or fidgety/restless 0 0   Suicidal thoughts 0 0   PHQ-9 Score 2 6   Difficult doing work/chores Not difficult at all Somewhat difficult      GEN: well developed, well nourished, no acute distress Eyes: conjunctiva and lids normal, PERRLA, EOMI ENT: TM clear, nares  clear, oral exam WNL Neck: supple, no lymphadenopathy, no thyromegaly, no JVD Pulm: clear to auscultation and percussion, respiratory effort normal CV: regular rate and rhythm, S1-S2, no murmur, rub or gallop, no bruits Chest: no scars, masses, no lumps BREAST: breast exam declined GI: soft, non-tender; no hepatosplenomegaly, masses; active bowel sounds all quadrants GU: GU exam declined Lymph: no cervical, axillary or inguinal adenopathy MSK: gait normal, muscle tone and strength WNL, no joint swelling, effusions, discoloration, crepitus  SKIN: clear, good turgor, color WNL, no rashes, lesions, or ulcerations Neuro: normal mental status, normal strength, sensation, and motion Psych: alert; oriented to person, place and time, normally interactive and not anxious or depressed in appearance.   All labs reviewed with patient. Results for orders placed or performed in visit on 07/12/22  HM PAP SMEAR   Collection Time: 06/12/22 12:00 AM  Result Value Ref Range   HM Pap smear Unsatisfactory   Results Console HPV   Collection Time: 06/12/22 12:00 AM  Result Value Ref Range   CHL HPV Negative   HM MAMMOGRAPHY   Collection Time: 06/28/22 12:00 AM  Result Value Ref Range   HM Mammogram 0-4 Bi-Rad 0-4 Bi-Rad, Self Reported Normal   No results found.  Assessment and Plan:     ICD-10-CM   1. Healthcare maintenance  Z00.00       Health Maintenance Exam: The patient's preventative maintenance and recommended screening tests for an annual wellness exam were reviewed in full today. Brought up to date unless services declined.  Counselled on the importance of diet, exercise, and its role in overall health and mortality. The patient's FH and SH was reviewed, including their home life, tobacco status, and drug and alcohol status.  Follow-up in 1 year for physical exam or additional follow-up below.  Disposition: No follow-ups on file.  Future Appointments  Date Time Provider Department  Center  09/26/2023 11:20 AM Eriyonna Matsushita, Jolena Nay, MD [facility]    No orders of the defined types were placed in this encounter.  There are no discontinued medications. No orders of the defined types were placed in this encounter.   Signed,  Ranny Bye. Alessander Sikorski, MD   Allergies as of 09/26/2023       Reactions   Amoxicillin    REACTION: rash from head to toe        Medication List        Accurate as of September 25, 2023 12:58 PM. If you have any questions, ask your nurse or doctor.          Adderall XR 30 MG 24 hr capsule Generic drug: amphetamine-dextroamphetamine Take 30 mg by mouth every morning.   busPIRone 15 MG tablet Commonly known as: BUSPAR Take 15 mg by mouth 3 (three) times daily.   citalopram 20 MG tablet Commonly known as: CELEXA Take 20 mg by mouth daily.   Junel 1.5/30 1.5-30 MG-MCG tablet Generic drug: Norethindrone Acetate-Ethinyl Estradiol TAKE 1 ACTIVE  TABLET DAILY. TAKE IN A CONTINUOUS FASHION   LORazepam  0.5 MG tablet Commonly known as: ATIVAN  Take 0.5 mg by mouth daily as needed for anxiety.   methylphenidate 20 MG tablet Commonly known as: RITALIN Take 20 mg by mouth daily.   spironolactone 100 MG tablet Commonly known as: ALDACTONE Take 100 mg by mouth daily.   SUMAtriptan 25 MG tablet Commonly known as: IMITREX Take 25 mg by mouth daily as needed.   traZODone 100 MG tablet Commonly known as: DESYREL Take 100-150 mg by mouth at bedtime as needed.   valACYclovir  1000 MG tablet Commonly known as: VALTREX  Take 2 tablets (2,000 mg total) by mouth 2 (two) times daily. As directed

## 2023-09-26 ENCOUNTER — Encounter: Payer: Self-pay | Admitting: Family Medicine

## 2023-09-26 ENCOUNTER — Ambulatory Visit (INDEPENDENT_AMBULATORY_CARE_PROVIDER_SITE_OTHER): Admitting: Family Medicine

## 2023-09-26 VITALS — BP 92/62 | HR 95 | Temp 98.6°F | Ht 67.0 in | Wt 145.0 lb

## 2023-09-26 DIAGNOSIS — R5383 Other fatigue: Secondary | ICD-10-CM | POA: Diagnosis not present

## 2023-09-26 DIAGNOSIS — E559 Vitamin D deficiency, unspecified: Secondary | ICD-10-CM

## 2023-09-26 DIAGNOSIS — Z0000 Encounter for general adult medical examination without abnormal findings: Secondary | ICD-10-CM | POA: Diagnosis not present

## 2023-09-26 DIAGNOSIS — Z131 Encounter for screening for diabetes mellitus: Secondary | ICD-10-CM

## 2023-09-26 DIAGNOSIS — Z1211 Encounter for screening for malignant neoplasm of colon: Secondary | ICD-10-CM

## 2023-09-26 LAB — CBC WITH DIFFERENTIAL/PLATELET
Basophils Absolute: 0 10*3/uL (ref 0.0–0.1)
Basophils Relative: 0.4 % (ref 0.0–3.0)
Eosinophils Absolute: 0.1 10*3/uL (ref 0.0–0.7)
Eosinophils Relative: 1.3 % (ref 0.0–5.0)
HCT: 41.5 % (ref 36.0–46.0)
Hemoglobin: 14 g/dL (ref 12.0–15.0)
Lymphocytes Relative: 24.3 % (ref 12.0–46.0)
Lymphs Abs: 2.5 10*3/uL (ref 0.7–4.0)
MCHC: 33.8 g/dL (ref 30.0–36.0)
MCV: 91.3 fl (ref 78.0–100.0)
Monocytes Absolute: 0.6 10*3/uL (ref 0.1–1.0)
Monocytes Relative: 6 % (ref 3.0–12.0)
Neutro Abs: 6.9 10*3/uL (ref 1.4–7.7)
Neutrophils Relative %: 68 % (ref 43.0–77.0)
Platelets: 360 10*3/uL (ref 150.0–400.0)
RBC: 4.55 Mil/uL (ref 3.87–5.11)
RDW: 13.7 % (ref 11.5–15.5)
WBC: 10.2 10*3/uL (ref 4.0–10.5)

## 2023-09-26 LAB — BASIC METABOLIC PANEL WITH GFR
BUN: 21 mg/dL (ref 6–23)
CO2: 31 meq/L (ref 19–32)
Calcium: 9.7 mg/dL (ref 8.4–10.5)
Chloride: 99 meq/L (ref 96–112)
Creatinine, Ser: 0.84 mg/dL (ref 0.40–1.20)
GFR: 84.03 mL/min (ref >60.00)
Glucose, Bld: 92 mg/dL (ref 70–99)
Potassium: 4.8 meq/L (ref 3.5–5.1)
Sodium: 137 meq/L (ref 135–145)

## 2023-09-26 LAB — HEPATIC FUNCTION PANEL
ALT: 61 U/L — ABNORMAL HIGH (ref 0–35)
AST: 32 U/L (ref 0–37)
Albumin: 4.4 g/dL (ref 3.5–5.2)
Alkaline Phosphatase: 104 U/L (ref 39–117)
Bilirubin, Direct: 0.1 mg/dL (ref 0.0–0.3)
Total Bilirubin: 0.4 mg/dL (ref 0.2–1.2)
Total Protein: 7.2 g/dL (ref 6.0–8.3)

## 2023-09-26 LAB — HEMOGLOBIN A1C: Hgb A1c MFr Bld: 5.3 % (ref 4.6–6.5)

## 2023-09-26 LAB — TSH: TSH: 1.41 u[IU]/mL (ref 0.35–5.50)

## 2023-09-26 LAB — VITAMIN D 25 HYDROXY (VIT D DEFICIENCY, FRACTURES): VITD: 56.05 ng/mL (ref 30.00–100.00)

## 2023-09-27 ENCOUNTER — Encounter: Payer: Self-pay | Admitting: Family Medicine

## 2023-10-12 ENCOUNTER — Telehealth: Payer: Self-pay | Admitting: *Deleted

## 2023-10-12 DIAGNOSIS — R7989 Other specified abnormal findings of blood chemistry: Secondary | ICD-10-CM

## 2023-10-12 NOTE — Telephone Encounter (Signed)
-----   Message from Beth Long sent at 10/12/2023  3:55 PM EDT ----- Regarding: Lab orders for, Fri, 5.30.25 Repeat labs, thanks

## 2023-10-26 ENCOUNTER — Other Ambulatory Visit (INDEPENDENT_AMBULATORY_CARE_PROVIDER_SITE_OTHER)

## 2023-10-26 DIAGNOSIS — R7989 Other specified abnormal findings of blood chemistry: Secondary | ICD-10-CM

## 2023-10-26 LAB — HEPATIC FUNCTION PANEL
ALT: 23 U/L (ref 0–35)
AST: 17 U/L (ref 0–37)
Albumin: 4.6 g/dL (ref 3.5–5.2)
Alkaline Phosphatase: 85 U/L (ref 39–117)
Bilirubin, Direct: 0.1 mg/dL (ref 0.0–0.3)
Total Bilirubin: 0.7 mg/dL (ref 0.2–1.2)
Total Protein: 7.9 g/dL (ref 6.0–8.3)

## 2023-10-27 ENCOUNTER — Ambulatory Visit: Payer: Self-pay | Admitting: Family Medicine

## 2023-11-29 ENCOUNTER — Telehealth: Payer: Self-pay | Admitting: *Deleted

## 2023-11-29 NOTE — Telephone Encounter (Addendum)
 GI referral for colonoscopy and demographic information faxed to [facility] at [phone] as requested.

## 2023-11-29 NOTE — Telephone Encounter (Signed)
 Referral updated

## 2023-11-29 NOTE — Telephone Encounter (Signed)
 Copied from CRM [id number]. Topic: Referral - Question >> Nov 29, 2023 10:27 AM Mercedes MATSU wrote: Reason for CRM: Patient needs colonoscopy referral resent to [facility] [place] [facility] fax number is [phone]

## 2023-12-05 NOTE — Telephone Encounter (Unsigned)
 Copied from CRM [id number]. Topic: Referral - Question >> Dec 05, 2023  1:57 PM Chasity T wrote: Reason for CRM: Patient is calling in because [facility] still hasn't received the referral for her and is unable to make an appointment. Please contact the clinic or patient to update them.

## 2023-12-07 NOTE — Telephone Encounter (Signed)
 Referral refaxed to [facility] to fax #[phone].

## 2023-12-08 ENCOUNTER — Encounter: Payer: Self-pay | Admitting: Family Medicine

## 2023-12-08 DIAGNOSIS — Z1211 Encounter for screening for malignant neoplasm of colon: Secondary | ICD-10-CM

## 2023-12-10 NOTE — Telephone Encounter (Signed)
 Can someone please call [facility] to find out what is going on with this referral.  It has been faxed mulitple times but patient is being told they don't have the referral.

## 2023-12-13 ENCOUNTER — Encounter: Payer: Self-pay | Admitting: Physician Assistant

## 2024-01-21 DIAGNOSIS — Z1231 Encounter for screening mammogram for malignant neoplasm of breast: Secondary | ICD-10-CM | POA: Diagnosis not present

## 2024-01-21 DIAGNOSIS — Z01419 Encounter for gynecological examination (general) (routine) without abnormal findings: Secondary | ICD-10-CM | POA: Diagnosis not present

## 2024-01-21 DIAGNOSIS — Z124 Encounter for screening for malignant neoplasm of cervix: Secondary | ICD-10-CM | POA: Diagnosis not present

## 2024-02-05 ENCOUNTER — Ambulatory Visit: Admitting: Physician Assistant

## 2024-02-07 ENCOUNTER — Encounter: Payer: Self-pay | Admitting: Physician Assistant

## 2024-03-19 DIAGNOSIS — B009 Herpesviral infection, unspecified: Secondary | ICD-10-CM | POA: Diagnosis not present

## 2024-03-19 DIAGNOSIS — Z86018 Personal history of other benign neoplasm: Secondary | ICD-10-CM | POA: Diagnosis not present

## 2024-03-19 DIAGNOSIS — D485 Neoplasm of uncertain behavior of skin: Secondary | ICD-10-CM | POA: Diagnosis not present

## 2024-03-19 DIAGNOSIS — L578 Other skin changes due to chronic exposure to nonionizing radiation: Secondary | ICD-10-CM | POA: Diagnosis not present

## 2024-03-19 DIAGNOSIS — L648 Other androgenic alopecia: Secondary | ICD-10-CM | POA: Diagnosis not present

## 2024-03-19 DIAGNOSIS — D225 Melanocytic nevi of trunk: Secondary | ICD-10-CM | POA: Diagnosis not present

## 2024-03-31 NOTE — Progress Notes (Unsigned)
 Ellouise Console, PA-C [address] [place], [place]  [address] Phone: [phone]   Gastroenterology Consultation  Referring Provider:     Watt Mirza, MD Primary Care Physician:  Watt Mirza, MD Primary Gastroenterologist:  Ellouise Console, PA-C / Glendia Holt, MD  Reason for Consultation:     Screening colonoscopy / hemorrhoids        HPI:   Discussed the use of AI scribe software for clinical note transcription with the patient, who gave verbal consent to proceed. History of Present Illness Beth Long is a 43 year old female who presents for a screening colonoscopy.  No previous EGD, colonoscopy, or GI evaluation.  She is age 23 and is due for first screening colonoscopy.  She has a history of rectal bleeding, constipation, and hemorrhoids since her pregnancy many years ago. The rectal bleeding is characterized by a small amount of blood on the tissue, typically occurring when constipated and during hemorrhoid flare-ups. She underwent hemorrhoid removal in 2009, which involved internal hemorrhoid banding and resulted in significant post-procedure discomfort.  She experiences daily bowel movements with occasional hard stools, influenced by her diet. She maintains a high-fiber diet and consumes 64 ounces of fluids daily to manage her symptoms. Over-the-counter treatments like Tux pads and stool softeners are used as needed. Her last episode of hemorrhoid bleeding was several months ago, but she feels the hemorrhoids are persistently present.  08/2023 labs: Normal CBC and Hgb 14.0.     Past Medical History:  Diagnosis Date   ADHD (attention deficit hyperactivity disorder), inattentive type 11/27/2019   GAD (generalized anxiety disorder) 12/29/2008   History of nephrolithiasis    Major depression in complete remission 04/04/2010   Migraine without aura 08/08/2010    Past Surgical History:  Procedure Laterality Date   BREAST REDUCTION SURGERY      Prior  to Admission medications   Medication Sig Start Date End Date Taking? Authorizing Provider  ADDERALL XR 30 MG 24 hr capsule Take 30 mg by mouth every morning.    [provider]  busPIRone (BUSPAR) 15 MG tablet Take 15 mg by mouth 3 (three) times daily. 06/01/20   [provider]  citalopram (CELEXA) 20 MG tablet Take 20 mg by mouth daily. 06/02/20   [provider]  LORazepam  (ATIVAN ) 1 MG tablet Take 1 mg by mouth daily as needed. 08/13/23   [provider]  methylphenidate (RITALIN) 20 MG tablet Take 20 mg by mouth daily as needed. 06/21/22   [provider]  minoxidil (LONITEN) 2.5 MG tablet Take 1.25 mg by mouth 2 (two) times daily. 09/04/23   [provider]  spironolactone (ALDACTONE) 100 MG tablet Take 100 mg by mouth daily. 06/29/22   [provider]  SUMAtriptan (IMITREX) 25 MG tablet Take 25 mg by mouth daily as needed. 06/23/22   [provider]  traZODone (DESYREL) 100 MG tablet Take 100-150 mg by mouth at bedtime as needed. 06/03/20   [provider]  valACYclovir  (VALTREX ) 1000 MG tablet Take 2,000 mg by mouth 2 (two) times daily as needed.    [provider]  Vitamin D , Ergocalciferol , (DRISDOL) 1.25 MG (50000 UNIT) CAPS capsule Take 50,000 Units by mouth once a week. 09/03/23   [provider]    Family History  Problem Relation Age of Onset   Cancer Maternal Grandmother        type unknown     Social History   Tobacco Use  Smoking status: Former    Current packs/day: 0.00    Types: Cigarettes    Quit date: 2001    Years since quitting: 24.8   Smokeless tobacco: Never   Tobacco comments:    Smoked for 1.5 years in college  Vaping Use   Vaping status: Never Used  Substance Use Topics   Alcohol use: Yes    Comment: 1-2 per month   Drug use: No    Allergies as of 04/01/2024 - Review Complete 04/01/2024  Allergen Reaction Noted   Amoxicillin  11/06/2009   Penicillins   04/01/2024    Review of Systems:    All systems reviewed and negative except where noted in HPI.   Physical Exam:  BP 100/80 (BP Location: Left Arm, Patient Position: Sitting)   Pulse (!) 112   Ht 5' 7 (1.702 m) Comment: height measured without shoes  Wt 142 lb 4 oz (64.5 kg)   BMI 22.28 kg/m  No LMP recorded. Patient has had an ablation.  General:   Alert,  Well-developed, well-nourished, pleasant and cooperative in NAD Lungs:  Respirations even and unlabored.  Clear throughout to auscultation.   No wheezes, crackles, or rhonchi. No acute distress. Heart:  Regular rate and rhythm; no murmurs, clicks, rubs, or gallops. Abdomen:  Normal bowel sounds.  No bruits.  Soft, and non-distended without masses, hepatosplenomegaly or hernias noted.  No Tenderness.  No guarding or rebound tenderness.    Neurologic:  Alert and oriented x3;  grossly normal neurologically. Psych:  Alert and cooperative. Normal mood and affect.   Imaging Studies: No results found.  Labs: CBC    Component Value Date/Time   WBC 10.2 09/26/2023 1206   RBC 4.55 09/26/2023 1206   HGB 14.0 09/26/2023 1206   HCT 41.5 09/26/2023 1206   PLT 360.0 09/26/2023 1206   MCV 91.3 09/26/2023 1206    CMP     Component Value Date/Time   NA 137 09/26/2023 1206   K 4.8 09/26/2023 1206   CL 99 09/26/2023 1206   CO2 31 09/26/2023 1206   GLUCOSE 92 09/26/2023 1206   BUN 21 09/26/2023 1206   CREATININE 0.84 09/26/2023 1206   CALCIUM 9.7 09/26/2023 1206   PROT 7.9 10/26/2023 0735   ALBUMIN 4.6 10/26/2023 0735   AST 17 10/26/2023 0735   ALT 23 10/26/2023 0735   ALKPHOS 85 10/26/2023 0735   BILITOT 0.7 10/26/2023 0735   GFRNONAA 123.55 12/21/2009 1225    Assessment and Plan:   Beth Long is a 43 y.o. y/o female has been referred for: Assessment & Plan 1.  Colon cancer screening - Scheduling Colonoscopy I discussed risks of colonoscopy with patient to include risk of bleeding, colon perforation, and risk of  sedation.  Patient expressed understanding and agrees to proceed with colonoscopy.   2.  Hemorrhoids Chronic with occasional flare-ups, exacerbated by constipation and rectal pressure. Previous banding was painful. Current symptoms mild, no recent bleeding. - Prescribed hydrocortisone 2.5% cream for flare-ups. - Advised on lifestyle modifications: high fiber diet, adequate hydration. - Discussed potential for prescription treatments if over-the-counter ineffective.  3.  Constipation Mild, ntermittent, related to dietary habits. No severe symptoms. Can exacerbate hemorrhoid symptoms. - Recommended high fiber diet: fruits, vegetables, whole grains. - Advised on hydration: 64 ounces of fluids daily. - Suggested OTC Miralax as needed. - Discussed OTC stool softeners if needed.    Follow up as needed based on colonoscopy results and GI symptoms.  Ellouise Console, PA-C

## 2024-04-01 ENCOUNTER — Ambulatory Visit: Admitting: Physician Assistant

## 2024-04-01 ENCOUNTER — Encounter: Payer: Self-pay | Admitting: Physician Assistant

## 2024-04-01 VITALS — BP 100/80 | HR 112 | Ht 67.0 in | Wt 142.2 lb

## 2024-04-01 DIAGNOSIS — K5900 Constipation, unspecified: Secondary | ICD-10-CM | POA: Diagnosis not present

## 2024-04-01 DIAGNOSIS — Z1211 Encounter for screening for malignant neoplasm of colon: Secondary | ICD-10-CM

## 2024-04-01 DIAGNOSIS — K649 Unspecified hemorrhoids: Secondary | ICD-10-CM | POA: Diagnosis not present

## 2024-04-01 MED ORDER — HYDROCORTISONE (PERIANAL) 2.5 % EX CREA
1.0000 | TOPICAL_CREAM | Freq: Two times a day (BID) | CUTANEOUS | 1 refills | Status: AC
Start: 2024-04-01 — End: ?

## 2024-04-01 MED ORDER — NA SULFATE-K SULFATE-MG SULF 17.5-3.13-1.6 GM/177ML PO SOLN: 1.0000 | Freq: Once | ORAL | 0 refills | Status: AC

## 2024-04-01 NOTE — Patient Instructions (Addendum)
   For constipation: Start OTC Miralax Powder Mix 1 capful in 6 to 8 ounces of a drink once daily  Recommend high-fiber diet, 30 g of fiber daily Eat fruits, vegetables, and whole grains Drink 64 ounces of water / fluids daily.   We have sent the following medications to your pharmacy for you to pick up at your convenience: Hydrocortisone Cream as needed  You have been scheduled for a Colonoscopy. Please follow written instructions given to you at your visit today.   If you use inhalers (even only as needed), please bring them with you on the day of your procedure.  DO NOT TAKE 7 DAYS PRIOR TO TEST- Trulicity (dulaglutide) Ozempic, Wegovy (semaglutide) Mounjaro (tirzepatide) Bydureon Bcise (exanatide extended release)  DO NOT TAKE 1 DAY PRIOR TO YOUR TEST Rybelsus (semaglutide) Adlyxin (lixisenatide) Victoza (liraglutide) Byetta (exanatide) ___________________________________________________________________________  Please follow up sooner if symptoms increase or worsen   Due to recent changes in healthcare laws, you may see the results of your imaging and laboratory studies on MyChart before your provider has had a chance to review them.  We understand that in some cases there may be results that are confusing or concerning to you. Not all laboratory results come back in the same time frame and the provider may be waiting for multiple results in order to interpret others.  Please give us  48 hours in order for your provider to thoroughly review all the results before contacting the office for clarification of your results.   Thank you for trusting me with your gastrointestinal care!   Ellouise Console, PA-C _______________________________________________________  If your blood pressure at your visit was 140/90 or greater, please contact your primary care physician to follow up on this.  _______________________________________________________  If you are age 66 or older, your body  mass index should be between 23-30. Your Body mass index is 22.28 kg/m. If this is out of the aforementioned range listed, please consider follow up with your Primary Care Provider.  If you are age 75 or younger, your body mass index should be between 19-25. Your Body mass index is 22.28 kg/m. If this is out of the aformentioned range listed, please consider follow up with your Primary Care Provider.   ________________________________________________________  The [facility] providers would like to encourage you to use MYCHART to communicate with providers for non-urgent requests or questions.  Due to long hold times on the telephone, sending your provider a message by [facility] may be a faster and more efficient way to get a response.  Please allow 48 business hours for a response.  Please remember that this is for non-urgent requests.  _______________________________________________________

## 2024-04-09 NOTE — Progress Notes (Signed)
 Agree with the assessment and plan as outlined by Brigitte Canard, PA-C.

## 2024-04-30 ENCOUNTER — Encounter: Payer: Self-pay | Admitting: Gastroenterology

## 2024-04-30 ENCOUNTER — Ambulatory Visit: Admitting: Gastroenterology

## 2024-04-30 VITALS — BP 102/75 | HR 73 | Temp 97.9°F | Resp 14 | Ht 67.0 in | Wt 142.0 lb

## 2024-04-30 DIAGNOSIS — Z1211 Encounter for screening for malignant neoplasm of colon: Secondary | ICD-10-CM | POA: Diagnosis not present

## 2024-04-30 DIAGNOSIS — K649 Unspecified hemorrhoids: Secondary | ICD-10-CM

## 2024-04-30 DIAGNOSIS — K640 First degree hemorrhoids: Secondary | ICD-10-CM | POA: Diagnosis not present

## 2024-04-30 DIAGNOSIS — K5730 Diverticulosis of large intestine without perforation or abscess without bleeding: Secondary | ICD-10-CM

## 2024-04-30 MED ORDER — SODIUM CHLORIDE 0.9 % IV SOLN: 500.0000 mL | Freq: Once | INTRAVENOUS | Status: DC

## 2024-04-30 NOTE — Patient Instructions (Signed)
 Please read handouts provided. Continue present medications. Recommend daily fiber supplement ( Metamucil ). Repeat colonoscopy in 10 years for screening.   YOU HAD AN ENDOSCOPIC PROCEDURE TODAY AT THE [facility]:   Refer to the procedure report that was given to you for any specific questions about what was found during the examination.  If the procedure report does not answer your questions, please call your gastroenterologist to clarify.  If you requested that your care partner not be given the details of your procedure findings, then the procedure report has been included in a sealed envelope for you to review at your convenience later.  YOU SHOULD EXPECT: Some feelings of bloating in the abdomen. Passage of more gas than usual.  Walking can help get rid of the air that was put into your GI tract during the procedure and reduce the bloating. If you had a lower endoscopy (such as a colonoscopy or flexible sigmoidoscopy) you may notice spotting of blood in your stool or on the toilet paper. If you underwent a bowel prep for your procedure, you may not have a normal bowel movement for a few days.  Please Note:  You might notice some irritation and congestion in your nose or some drainage.  This is from the oxygen used during your procedure.  There is no need for concern and it should clear up in a day or so.  SYMPTOMS TO REPORT IMMEDIATELY:  Following lower endoscopy (colonoscopy or flexible sigmoidoscopy):  Excessive amounts of blood in the stool  Significant tenderness or worsening of abdominal pains  Swelling of the abdomen that is new, acute  Fever of 100F or higher.  For urgent or emergent issues, a gastroenterologist can be reached at any hour by calling [phone]. Do not use MyChart messaging for urgent concerns.    DIET:  We do recommend a small meal at first, but then you may proceed to your regular diet.  Drink plenty of fluids but you should avoid alcoholic  beverages for 24 hours.  ACTIVITY:  You should plan to take it easy for the rest of today and you should NOT DRIVE or use heavy machinery until tomorrow (because of the sedation medicines used during the test).    FOLLOW UP: Our staff will call the number listed on your records the next business day following your procedure.  We will call around 7:15- 8:00 am to check on you and address any questions or concerns that you may have regarding the information given to you following your procedure. If we do not reach you, we will leave a message.     If any biopsies were taken you will be contacted by phone or by letter within the next 1-3 weeks.  Please call us  at [phone] if you have not heard about the biopsies in 3 weeks.    SIGNATURES/CONFIDENTIALITY: You and/or your care partner have signed paperwork which will be entered into your electronic medical record.  These signatures attest to the fact that that the information above on your After Visit Summary has been reviewed and is understood.  Full responsibility of the confidentiality of this discharge information lies with you and/or your care-partner.

## 2024-04-30 NOTE — Op Note (Signed)
 [facility] Patient Name: Beth Long Procedure Date: 04/30/2024 1:13 PM MRN: [id number] Endoscopist: Glendia E. Stacia , MD, [id number] Age: 43 Referring MD:  Date of Birth: February 25, 1981 Gender: Female Account #: [id number] Procedure:                Colonoscopy Indications:              Screening for colorectal malignant neoplasm, This                            is the patient's first colonoscopy Medicines:                Monitored Anesthesia Care Procedure:                Pre-Anesthesia Assessment:                           - Prior to the procedure, a History and Physical                            was performed, and patient medications and                            allergies were reviewed. The patient's tolerance of                            previous anesthesia was also reviewed. The risks                            and benefits of the procedure and the sedation                            options and risks were discussed with the patient.                            All questions were answered, and informed consent                            was obtained. Prior Anticoagulants: The patient has                            taken no anticoagulant or antiplatelet agents. ASA                            Grade Assessment: I - A normal, healthy patient.                            After reviewing the risks and benefits, the patient                            was deemed in satisfactory condition to undergo the                            procedure.  After obtaining informed consent, the colonoscope                            was passed under direct vision. Throughout the                            procedure, the patient's blood pressure, pulse, and                            oxygen saturations were monitored continuously. The                            Olympus CF-HQ190L ([id number]) Colonoscope was                            introduced through the anus and  advanced to the the                            cecum, identified by appendiceal orifice and                            ileocecal valve. The colonoscopy was performed                            without difficulty. The patient tolerated the                            procedure well. The quality of the bowel                            preparation was good. The ileocecal valve,                            appendiceal orifice, and rectum were photographed.                            The bowel preparation used was SUPREP via split                            dose instruction. Scope In: 1:25:25 PM Scope Out: 1:40:52 PM Scope Withdrawal Time: 0 hours 9 minutes 13 seconds  Total Procedure Duration: 0 hours 15 minutes 27 seconds  Findings:                 The perianal and digital rectal examinations were                            normal. Pertinent negatives include normal                            sphincter tone and no palpable rectal lesions.                           Multiple medium-mouthed and small-mouthed  diverticula were found in the sigmoid colon and                            descending colon.                           The exam was otherwise normal throughout the                            examined colon.                           Non-bleeding internal hemorrhoids were found during                            retroflexion. The hemorrhoids were Grade I                            (internal hemorrhoids that do not prolapse).                           No additional abnormalities were found on                            retroflexion. Complications:            No immediate complications. Estimated Blood Loss:     Estimated blood loss: none. Impression:               - Moderate diverticulosis in the sigmoid colon and                            in the descending colon.                           - Non-bleeding internal hemorrhoids.                           - No  specimens collected. Recommendation:           - Patient has a contact number available for                            emergencies. The signs and symptoms of potential                            delayed complications were discussed with the                            patient. Return to normal activities tomorrow.                            Written discharge instructions were provided to the                            patient.                           -  Resume previous diet.                           - Continue present medications.                           - Repeat colonoscopy in 10 years for screening                            purposes.                           - Recommend daily fiber supplement (Metamucil) to                            reduce risk of diverticular complications and                            reduce hemorrhoidal symptoms. Paisyn Guercio E. Stacia, MD 04/30/2024 1:50:30 PM This report has been signed electronically.

## 2024-04-30 NOTE — Progress Notes (Signed)
 Pt's states no medical or surgical changes since previsit or office visit.

## 2024-04-30 NOTE — Progress Notes (Signed)
 A/o x 3, VSS, good SR's, pleased with anesthesia, report to RN

## 2024-04-30 NOTE — Progress Notes (Signed)
 History and Physical Interval Note:  04/30/2024 1:15 PM  Beth Long  has presented today for endoscopic procedure(s), with the diagnosis of  Encounter Diagnoses  Name Primary?   Hemorrhoids, unspecified hemorrhoid type Yes   Colon cancer screening   .  The various methods of evaluation and treatment have been discussed with the patient and/or family. After consideration of risks, benefits and other options for treatment, the patient has consented to  the endoscopic procedure(s).   The patient's history has been reviewed, patient examined, no change in status, stable for endoscopic procedure(s).  I have reviewed the patient's chart and labs.  Questions were answered to the patient's satisfaction.     Beth Long E. Stacia, MD [facility]

## 2024-05-01 ENCOUNTER — Telehealth: Payer: Self-pay

## 2024-05-01 NOTE — Telephone Encounter (Signed)
  Follow up Call-     04/30/2024   12:56 PM  Call back number  Post procedure Call Back phone  # [phone]  Permission to leave phone message Yes     Patient questions:  Do you have a fever, pain , or abdominal swelling? No. Pain Score  0 *  Have you tolerated food without any problems? Yes.    Have you been able to return to your normal activities? Yes.    Do you have any questions about your discharge instructions: Diet   No. Medications  No. Follow up visit  No.  Do you have questions or concerns about your Care? No.  Actions: * If pain score is 4 or above: No action needed, pain <4.

## 2024-05-07 DIAGNOSIS — Z1231 Encounter for screening mammogram for malignant neoplasm of breast: Secondary | ICD-10-CM | POA: Diagnosis not present
